# Patient Record
Sex: Male | Born: 1990 | Race: Black or African American | Hispanic: No | Marital: Single | Smoking: Never smoker
Health system: Southern US, Community
[De-identification: ages and names within clinical notes are randomized; demographics above are authoritative.]

## PROBLEM LIST (undated history)

## (undated) DIAGNOSIS — J45909 Unspecified asthma, uncomplicated: Secondary | ICD-10-CM

---
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
## 2013-06-06 ENCOUNTER — Emergency Department (HOSPITAL_COMMUNITY)
Admission: EM | Admit: 2013-06-06 | Discharge: 2013-06-06 | Disposition: A | Discharge status: Home / Self Care | Payer: Self-pay | Attending: Emergency Medicine | Admitting: Emergency Medicine

## 2013-06-06 ENCOUNTER — Encounter (HOSPITAL_COMMUNITY): Payer: Self-pay | Admitting: Emergency Medicine

## 2013-06-06 DIAGNOSIS — G8929 Other chronic pain: Secondary | ICD-10-CM | POA: Insufficient documentation

## 2013-06-06 DIAGNOSIS — J45909 Unspecified asthma, uncomplicated: Secondary | ICD-10-CM | POA: Insufficient documentation

## 2013-06-06 DIAGNOSIS — K089 Disorder of teeth and supporting structures, unspecified: Secondary | ICD-10-CM | POA: Insufficient documentation

## 2013-06-06 DIAGNOSIS — K0889 Other specified disorders of teeth and supporting structures: Secondary | ICD-10-CM

## 2013-06-06 DIAGNOSIS — H9209 Otalgia, unspecified ear: Secondary | ICD-10-CM | POA: Insufficient documentation

## 2013-06-06 HISTORY — DX: Unspecified asthma, uncomplicated: J45.909

## 2013-06-06 MED ORDER — HYDROCODONE-ACETAMINOPHEN 5-325 MG PO TABS
1.0000 | ORAL_TABLET | Freq: Once | ORAL | Status: AC
  Administered 2013-06-06: 1 via ORAL
  Filled 2013-06-06: qty 1

## 2013-06-06 MED ORDER — PENICILLIN V POTASSIUM 500 MG PO TABS: 500.0000 mg | ORAL_TABLET | Freq: Four times a day (QID) | ORAL | Status: AC

## 2013-06-06 MED ORDER — HYDROCODONE-ACETAMINOPHEN 5-325 MG PO TABS: 1.0000 | ORAL_TABLET | ORAL | Status: DC | PRN

## 2013-06-06 NOTE — ED Notes (Signed)
Patient presents with c/o mouth pain that started about 3-4 months ago.  The last couple of weeks he feels like his mouth has been swelling more.  States he thinks it is his wisdom teeth.

## 2013-06-06 NOTE — ED Provider Notes (Signed)
CSN: [id number]     Arrival date & time 06/06/13  1826 History  This chart was scribed for non-physician practitioner Trixie Dredge, PA, working with Ethelda Chick, MD by Ronal Fear, ED scribe. This patient was seen in room TR05C/TR05C and the patient's care was started at 7:31 PM.    Chief Complaint  Patient presents with  . Dental Pain   The history is provided by the patient. No language interpreter was used.  HPI Comments: Daniel Potts is a 22 y.o. male who presents to the Emergency Department complaining of 4 months of sudden onset throbbing HA and facial pain from 10/10 dental pain that suddenly worsened this week.  Pain is worse with eating hot food. He has taken ibuprofen and has tried gargling with no relief.  Pt denies fever, chills, body aches, difficulty breathing or swallowing, and ear pain. Pt does not appear to be in any acute distress with no other complaints.   Past Medical History  Diagnosis Date  . Asthma    History reviewed. No pertinent past surgical history. History reviewed. No pertinent family history. History  Substance Use Topics  . Smoking status: Never Smoker   . Smokeless tobacco: Never Used  . Alcohol Use: No    Review of Systems  Constitutional: Negative for fever and chills.  HENT: Positive for ear pain. Negative for facial swelling, sore throat and trouble swallowing.   Respiratory: Negative for cough and shortness of breath.   Cardiovascular: Negative for chest pain.  Gastrointestinal: Negative for vomiting.    Allergies  Shellfish allergy  Home Medications   Current Outpatient Rx  Name  Route  Sig  Dispense  Refill  . ibuprofen (ADVIL,MOTRIN) 200 MG tablet   Oral   Take 400 mg by mouth every 6 (six) hours as needed for pain (pain).          BP 137/79  Pulse 64  Temp(Src) 97.8 F (36.6 C) (Oral)  Resp 18  Ht 6\' 2"  (1.88 m)  Wt 160 lb 12.8 oz (72.938 kg)  BMI 20.64 kg/m2  SpO2 100% Physical Exam  Nursing note and vitals  reviewed. Constitutional: He appears well-developed and well-nourished. No distress.  HENT:  Head: Normocephalic and atraumatic.  Mouth/Throat: Oropharynx is clear and moist. No oropharyngeal exudate, posterior oropharyngeal edema or posterior oropharyngeal erythema.  Left lower 3rd molar is non tender with signs of decay. Left upper 3rd molar with tenderness posterior to tooth and adjacent to tooth with erythema of gingiva, no discharge. No tenderness to percussion of tooth No facial swelling, left TM normal, no cervical adenopathy. No exudate, no edema. no erythema  Neck: Neck supple.  Pulmonary/Chest: Effort normal.  Neurological: He is alert.  Skin: He is not diaphoretic.    ED Course  Procedures (including critical care time)  DIAGNOSTIC STUDIES: Oxygen Saturation is 100% on RA, normal by my interpretation.    COORDINATION OF CARE:   7:44 PM- Pt advised of plan for treatment including a dental referral and pain medication and pt agrees.   Labs Review Labs Reviewed - No data to display Imaging Review No results found.  EKG Interpretation   None       MDM   1. Pain, dental    Pt with chronic dental pain with sudden worsening this week.  No obvious abscess but will treat as abscess.  There is erythema surrounding the affected tooth.  D/C home with penicillin, vicodin, dental follow up and dental resources.  Discussed  findings, treatment, and follow up  with patient.  Pt given return precautions.  Pt verbalizes understanding and agrees with plan.      I personally performed the services described in this documentation, which was scribed in my presence. The recorded information has been reviewed and is accurate.   I doubt any other EMC precluding discharge at this time including, but not necessarily limited to the following: deep space head or neck infection, ludwig's angina   [place], PA-C 06/07/13 0011

## 2013-06-06 NOTE — ED Notes (Signed)
Patient present with c/i mouth pain.  States he feels like his gums are swelling up in the back.  Pain has been going on for 3-4 months but in the last 2 weeks has gotten worse.  Has been taking Motrin, using Oralgel and brushing his teeth with salt water

## 2013-06-07 NOTE — ED Provider Notes (Signed)
Medical screening examination/treatment/procedure(s) were performed by non-physician practitioner and as supervising physician I was immediately available for consultation/collaboration.  EKG Interpretation   None        Ethelda Chick, MD 06/07/13 [phone]

## 2018-04-06 ENCOUNTER — Encounter (HOSPITAL_COMMUNITY): Payer: Self-pay | Admitting: Emergency Medicine

## 2018-04-06 ENCOUNTER — Emergency Department (HOSPITAL_COMMUNITY): Payer: Self-pay

## 2018-04-06 ENCOUNTER — Emergency Department (HOSPITAL_COMMUNITY)
Admission: EM | Admit: 2018-04-06 | Discharge: 2018-04-06 | Disposition: A | Discharge status: Home / Self Care | Payer: Self-pay | Attending: Emergency Medicine | Admitting: Emergency Medicine

## 2018-04-06 DIAGNOSIS — Y929 Unspecified place or not applicable: Secondary | ICD-10-CM | POA: Insufficient documentation

## 2018-04-06 DIAGNOSIS — S62339A Displaced fracture of neck of unspecified metacarpal bone, initial encounter for closed fracture: Secondary | ICD-10-CM

## 2018-04-06 DIAGNOSIS — J45909 Unspecified asthma, uncomplicated: Secondary | ICD-10-CM | POA: Insufficient documentation

## 2018-04-06 DIAGNOSIS — W228XXA Striking against or struck by other objects, initial encounter: Secondary | ICD-10-CM | POA: Insufficient documentation

## 2018-04-06 DIAGNOSIS — S62326A Displaced fracture of shaft of fifth metacarpal bone, right hand, initial encounter for closed fracture: Secondary | ICD-10-CM | POA: Insufficient documentation

## 2018-04-06 DIAGNOSIS — Y999 Unspecified external cause status: Secondary | ICD-10-CM | POA: Insufficient documentation

## 2018-04-06 DIAGNOSIS — Y9389 Activity, other specified: Secondary | ICD-10-CM | POA: Insufficient documentation

## 2018-04-06 DIAGNOSIS — Z79899 Other long term (current) drug therapy: Secondary | ICD-10-CM | POA: Insufficient documentation

## 2018-04-06 MED ORDER — HYDROCODONE-ACETAMINOPHEN 5-325 MG PO TABS: 1.0000 | ORAL_TABLET | ORAL | 0 refills | Status: DC | PRN

## 2018-04-06 MED ORDER — OXYCODONE-ACETAMINOPHEN 5-325 MG PO TABS
1.0000 | ORAL_TABLET | Freq: Once | ORAL | Status: AC
  Administered 2018-04-06: 1 via ORAL
  Filled 2018-04-06: qty 1

## 2018-04-06 NOTE — ED Triage Notes (Signed)
Pt reports he hit a brick wall w/ his left hand.  Hand is swelling and small abrasion to knuckle.

## 2018-04-06 NOTE — ED Notes (Signed)
Unable to obtain e-sign d/t equipment malfunction.  Pt verbalized understanding of d/c instructions, medications, f/u and return precautions.

## 2018-04-06 NOTE — Discharge Instructions (Signed)
Take the prescribed medication as directed.  Leave splint in place until follow-up. Follow-up with Dr. Amanda Pea-- call for appt. Return to the ED for new or worsening symptoms.

## 2018-04-06 NOTE — ED Provider Notes (Signed)
[facility] EMERGENCY DEPARTMENT Provider Note   CSN: [id number] Arrival date & time: 04/06/18  0315     History   Chief Complaint Chief Complaint  Patient presents with  . Hand Injury    HPI MAKHAI FULCO is a 27 y.o. male.  The history is provided by the patient and medical records.  Hand Injury      27 y.o. M with hx of asthma, presenting to the ED for right hand pain.  States he got upset and punched a brick wall.  States he did not realize the wall was brick at the time.  Has abrasion to right fifth MCP joint and deformity of the right fifth metacarpal.  Denies numbness/weakness.  No intervention tried PTA.  Past Medical History:  Diagnosis Date  . Asthma     There are no active problems to display for this patient.   History reviewed. No pertinent surgical history.      Home Medications    Prior to Admission medications   Medication Sig Start Date End Date Taking? Authorizing Provider  HYDROcodone-acetaminophen (NORCO/VICODIN) 5-325 MG per tablet Take 1 tablet by mouth every 4 (four) hours as needed for pain. 06/06/13   Trixie Dredge, PA-C  ibuprofen (ADVIL,MOTRIN) 200 MG tablet Take 400 mg by mouth every 6 (six) hours as needed for pain (pain).    [provider]    Family History No family history on file.  Social History Social History   Tobacco Use  . Smoking status: Never Smoker  . Smokeless tobacco: Never Used  Substance Use Topics  . Alcohol use: No  . Drug use: No     Allergies   Shellfish allergy   Review of Systems Review of Systems  Musculoskeletal: Positive for arthralgias and joint swelling.  All other systems reviewed and are negative.    Physical Exam Updated Vital Signs BP 123/88   Pulse 60   Temp 98.9 F (37.2 C) (Oral)   Resp 16   SpO2 99%   Physical Exam  Constitutional: He is oriented to person, place, and time. He appears well-developed and well-nourished.  HENT:  Head:  Normocephalic and atraumatic.  Mouth/Throat: Oropharynx is clear and moist.  Eyes: Pupils are equal, round, and reactive to light. Conjunctivae and EOM are normal.  Neck: Normal range of motion.  Cardiovascular: Normal rate, regular rhythm and normal heart sounds.  Pulmonary/Chest: Effort normal and breath sounds normal.  Abdominal: Soft. Bowel sounds are normal.  Musculoskeletal: Normal range of motion.  Abrasion over right 5th MCP joint without open wound/laceration; deformity over distal 5th metacarpal; able to move fingers normally but pain when moving 5th digit; normal distal sensation/perfusion  Neurological: He is alert and oriented to person, place, and time.  Skin: Skin is warm and dry.  Psychiatric: He has a normal mood and affect.  Nursing note and vitals reviewed.    ED Treatments / Results  Labs (all labs ordered are listed, but only abnormal results are displayed) Labs Reviewed - No data to display  EKG None  Radiology Dg Hand Complete Right  Result Date: 04/06/2018 CLINICAL DATA:  Struck a brick wall. EXAM: RIGHT HAND - COMPLETE 3+ VIEW COMPARISON:  None. FINDINGS: Acute comminuted distal fifth metacarpal metadiaphyseal fracture without intra-articular extension. Palmar angulation of the distal bony fragments. No dislocation. No destructive bony lesions. Dorsal hand soft tissue swelling without subcutaneous gas or radiopaque foreign bodies. IMPRESSION: Acute displaced fifth metacarpus fracture. Electronically Signed   By:  Awilda Metro M.D.   On: 04/06/2018 04:20    Procedures Procedures (including critical care time)  Medications Ordered in ED Medications  oxyCODONE-acetaminophen (PERCOCET/ROXICET) 5-325 MG per tablet 1 tablet (has no administration in time range)     Initial Impression / Assessment and Plan / ED Course  I have reviewed the triage vital signs and the nursing notes.  Pertinent labs & imaging results that were available during my care of the  patient were reviewed by me and considered in my medical decision making (see chart for details).  27 year old male here with right hand injury after punching a brick wall.  Arrives with deformity of the right fifth metacarpal.  X-ray confirms boxer's fracture.  Hand is neurovascularly intact.  Ulnar gutter splint was placed and he will be referred to hand surgery for follow-up.  Discussed plan with patient, he acknowledged understanding and agreed with plan of care.  Return precautions given for new or worsening symptoms.  Final Clinical Impressions(s) / ED Diagnoses   Final diagnoses:  Closed boxer's fracture, initial encounter    ED Discharge Orders         Ordered    HYDROcodone-acetaminophen (NORCO/VICODIN) 5-325 MG tablet  Every 4 hours PRN     04/06/18 0532           Garlon Hatchet, PA-C 04/06/18 5374    Geoffery Lyons, MD 04/06/18 [phone]

## 2018-04-06 NOTE — ED Notes (Signed)
Patient transported to X-ray 

## 2018-04-06 NOTE — ED Notes (Signed)
Ortho tech at bedside for short arm splint

## 2018-04-08 ENCOUNTER — Ambulatory Visit (INDEPENDENT_AMBULATORY_CARE_PROVIDER_SITE_OTHER): Payer: Self-pay | Admitting: Family Medicine

## 2018-04-08 ENCOUNTER — Encounter: Payer: Self-pay | Admitting: Family Medicine

## 2018-04-08 VITALS — BP 108/71 | HR 58 | Ht 74.0 in | Wt 178.0 lb

## 2018-04-08 DIAGNOSIS — S62339A Displaced fracture of neck of unspecified metacarpal bone, initial encounter for closed fracture: Secondary | ICD-10-CM

## 2018-04-08 NOTE — Patient Instructions (Signed)
Change the dressing every 2 days at least (more if it seems like it's soaking through though I doubt this will be the case). You have a boxer's fracture of your 5th metacarpal. Tylenol and/or ibuprofen as needed for pain. Follow up with me in 1 week for repeat x-rays, we will discuss transition to a cast. These will take about 6 weeks to heal typically. Elevate above your heart when possible to help with the swelling.

## 2018-04-08 NOTE — Progress Notes (Signed)
PCP: Patient, No Pcp Per  Subjective:   HPI: Patient is a 27 y.o. male here for right hand injury.  Patient reports on 9/1 he punched a wall sustaining injury to ulnar side of right hand. Pain sharp, now down to 5/10 level. Has been wearing ulnar gutter splint regularly. Had a cut between 4th and 5th digits, covered with gauze. No fever. No numbness, other skin changes. No prior injuries. 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  Past Medical History:  Diagnosis Date  . Asthma     Current Outpatient Medications on File Prior to Visit  Medication Sig Dispense Refill  . HYDROcodone-acetaminophen (NORCO/VICODIN) 5-325 MG tablet Take 1 tablet by mouth every 4 (four) hours as needed. 20 tablet 0  . ibuprofen (ADVIL,MOTRIN) 200 MG tablet Take 400 mg by mouth every 6 (six) hours as needed for pain (pain).     No current facility-administered medications on file prior to visit.     History reviewed. No pertinent surgical history.  Allergies  Allergen Reactions  . Shellfish Allergy     SOB, hives, swelling    Social History   Socioeconomic History  . Marital status: Single    Spouse name: Not on file  . Number of children: Not on file  . Years of education: Not on file  . Highest education level: Not on file  Occupational History  . Not on file  Social Needs  . Financial resource strain: Not on file  . Food insecurity:    Worry: Not on file    Inability: Not on file  . Transportation needs:    Medical: Not on file    Non-medical: Not on file  Tobacco Use  . Smoking status: Never Smoker  . Smokeless tobacco: Never Used  Substance and Sexual Activity  . Alcohol use: No  . Drug use: No  . Sexual activity: Yes    Birth control/protection: Condom  Lifestyle  . Physical activity:    Days per week: Not on file    Minutes per session: Not on file  . Stress: Not on file  Relationships  . Social connections:    Talks on phone: Not on file    Gets together: Not on  file    Attends religious service: Not on file    Active member of club or organization: Not on file    Attends meetings of clubs or organizations: Not on file    Relationship status: Not on file  . Intimate partner violence:    Fear of current or ex partner: Not on file    Emotionally abused: Not on file    Physically abused: Not on file    Forced sexual activity: Not on file  Other Topics Concern  . Not on file  Social History Narrative  . Not on file    History reviewed. No pertinent family history.  BP 108/71   Pulse (!) 58   Ht 6\' 2"  (1.88 m)   Wt 178 lb (80.7 kg)   BMI 22.85 kg/m   Review of Systems: See HPI above.     Objective:  Physical Exam:  Gen: NAD, comfortable in exam room  Right hand: Splint removed. Small laceration noted web space between 4th, 5th digits, minimal separation.  No redness, other skin changes. Swelling dorsal distal hand, into 4th and 5th digit. Able to flex and extend at MCP, PIP, DIP joints and wrist. TTP ulnar side of hand, 5th metacarpal. NVI distally.  Assessment & Plan:  1. Right 5th metacarpal boxer's fracture - Independently reviewed radiographs and noted fracture with mild volar angulation.  Dressing change made to small laceration.  Continue with ulnar gutter splint for 1 week.  Tylenol and/or ibuprofen if needed.  Elevation.  F/u in 1 week.

## 2018-04-15 ENCOUNTER — Ambulatory Visit (HOSPITAL_BASED_OUTPATIENT_CLINIC_OR_DEPARTMENT_OTHER)
Admission: RE | Admit: 2018-04-15 | Discharge: 2018-04-15 | Disposition: A | Discharge status: Home / Self Care | Payer: Self-pay | Source: Ambulatory Visit | Attending: Family Medicine | Admitting: Family Medicine

## 2018-04-15 ENCOUNTER — Encounter: Payer: Self-pay | Admitting: Family Medicine

## 2018-04-15 ENCOUNTER — Ambulatory Visit (INDEPENDENT_AMBULATORY_CARE_PROVIDER_SITE_OTHER): Payer: Self-pay | Admitting: Family Medicine

## 2018-04-15 VITALS — BP 109/74 | HR 60 | Ht 74.0 in | Wt 180.0 lb

## 2018-04-15 DIAGNOSIS — S62366D Nondisplaced fracture of neck of fifth metacarpal bone, right hand, subsequent encounter for fracture with routine healing: Secondary | ICD-10-CM | POA: Insufficient documentation

## 2018-04-15 DIAGNOSIS — X58XXXD Exposure to other specified factors, subsequent encounter: Secondary | ICD-10-CM | POA: Insufficient documentation

## 2018-04-15 DIAGNOSIS — S6291XS Unspecified fracture of right wrist and hand, sequela: Secondary | ICD-10-CM

## 2018-04-15 NOTE — Patient Instructions (Signed)
Your x-rays look great. You have a boxer's fracture of your 5th metacarpal. Tylenol and/or ibuprofen as needed for pain. Follow up with me in 2 weeks. If you wear the splint only remove twice a week to wash the area, dry it, then put it back on. These will take about 6 weeks total to heal typically. Elevate above your heart when possible to help with the swelling.

## 2018-04-17 ENCOUNTER — Encounter: Payer: Self-pay | Admitting: Family Medicine

## 2018-04-17 NOTE — Progress Notes (Signed)
PCP: Patient, No Pcp Per  Subjective:   HPI: Patient is a 27 y.o. male here for right hand injury.  9/3: Patient reports on 9/1 he punched a wall sustaining injury to ulnar side of right hand. Pain sharp, now down to 5/10 level. Has been wearing ulnar gutter splint regularly. Had a cut between 4th and 5th digits, covered with gauze. No fever. No numbness, other skin changes. No prior injuries. 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  9/10: Patient returns doing well. Pain level down to 0/10. Has been tolerating splint. No skin changes.  Past Medical History:  Diagnosis Date  . Asthma     Current Outpatient Medications on File Prior to Visit  Medication Sig Dispense Refill  . HYDROcodone-acetaminophen (NORCO/VICODIN) 5-325 MG tablet Take 1 tablet by mouth every 4 (four) hours as needed. 20 tablet 0  . ibuprofen (ADVIL,MOTRIN) 200 MG tablet Take 400 mg by mouth every 6 (six) hours as needed for pain (pain).     No current facility-administered medications on file prior to visit.     History reviewed. No pertinent surgical history.  Allergies  Allergen Reactions  . Shellfish Allergy     SOB, hives, swelling    Social History   Socioeconomic History  . Marital status: Single    Spouse name: Not on file  . Number of children: Not on file  . Years of education: Not on file  . Highest education level: Not on file  Occupational History  . Not on file  Social Needs  . Financial resource strain: Not on file  . Food insecurity:    Worry: Not on file    Inability: Not on file  . Transportation needs:    Medical: Not on file    Non-medical: Not on file  Tobacco Use  . Smoking status: Never Smoker  . Smokeless tobacco: Never Used  Substance and Sexual Activity  . Alcohol use: No  . Drug use: No  . Sexual activity: Yes    Birth control/protection: Condom  Lifestyle  . Physical activity:    Days per week: Not on file    Minutes per session: Not on file  .  Stress: Not on file  Relationships  . Social connections:    Talks on phone: Not on file    Gets together: Not on file    Attends religious service: Not on file    Active member of club or organization: Not on file    Attends meetings of clubs or organizations: Not on file    Relationship status: Not on file  . Intimate partner violence:    Fear of current or ex partner: Not on file    Emotionally abused: Not on file    Physically abused: Not on file    Forced sexual activity: Not on file  Other Topics Concern  . Not on file  Social History Narrative  . Not on file    History reviewed. No pertinent family history.  BP 109/74   Pulse 60   Ht 6\' 2"  (1.88 m)   Wt 180 lb (81.6 kg)   BMI 23.11 kg/m   Review of Systems: See HPI above.     Objective:  Physical Exam:  Gen: NAD, comfortable in exam room  Right hand: Laceration nearly completely healed between 4th, 5th digits.  Loss of knuckle.  No bruising, other deformity.  No malrotation or angulation. Decreased motion of 5th MCP - lacks about 5 degrees extension.  Only  flexes to 40 degrees.  Strength 5/5 with finger extension and flexion at MCP, PIP, DIP joints. Minimal tenderness of 5th metacarpal head. NVI distally.  Assessment & Plan:  1. Right 5th metacarpal boxer's fracture - Independently reviewed repeat radiographs and no additional displacement, should heal well with conservative treatment.  Placed inn ulnar gutter cast today.  Tylenol, ibuprofen if needed.  F/u in 2 weeks to remove, repeat x-rays, and place new splint or cast.

## 2018-04-29 ENCOUNTER — Ambulatory Visit: Payer: Self-pay | Admitting: Family Medicine

## 2018-05-02 ENCOUNTER — Ambulatory Visit (HOSPITAL_BASED_OUTPATIENT_CLINIC_OR_DEPARTMENT_OTHER)
Admission: RE | Admit: 2018-05-02 | Discharge: 2018-05-02 | Disposition: A | Discharge status: Home / Self Care | Payer: Self-pay | Source: Ambulatory Visit | Attending: Family Medicine | Admitting: Family Medicine

## 2018-05-02 ENCOUNTER — Ambulatory Visit (INDEPENDENT_AMBULATORY_CARE_PROVIDER_SITE_OTHER): Payer: Self-pay | Admitting: Family Medicine

## 2018-05-02 ENCOUNTER — Encounter: Payer: Self-pay | Admitting: Family Medicine

## 2018-05-02 VITALS — BP 117/74 | HR 55 | Ht 74.0 in | Wt 180.0 lb

## 2018-05-02 DIAGNOSIS — S62339D Displaced fracture of neck of unspecified metacarpal bone, subsequent encounter for fracture with routine healing: Secondary | ICD-10-CM

## 2018-05-02 DIAGNOSIS — X58XXXD Exposure to other specified factors, subsequent encounter: Secondary | ICD-10-CM | POA: Insufficient documentation

## 2018-05-02 NOTE — Patient Instructions (Signed)
You're doing great. Return to work on Monday. I wouldn't play any sports that would put you at risk of falling or striking your hand for the next 2 weeks. Expect some soreness the next few days. Tylenol, ibuprofen only if needed. Follow up with me as needed.

## 2018-05-04 ENCOUNTER — Encounter: Payer: Self-pay | Admitting: Family Medicine

## 2018-05-04 NOTE — Progress Notes (Signed)
PCP: Patient, No Pcp Per  Subjective:   HPI: Patient is a 27 y.o. male here for right hand injury.  9/3: Patient reports on 9/1 he punched a wall sustaining injury to ulnar side of right hand. Pain sharp, now down to 5/10 level. Has been wearing ulnar gutter splint regularly. Had a cut between 4th and 5th digits, covered with gauze. No fever. No numbness, other skin changes. No prior injuries. 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  9/10: Patient returns doing well. Pain level down to 0/10. Has been tolerating splint. No skin changes.  9/27: Patient reports he's doing well. Denies any pain. Is wearing ulnar gutter splint. No skin changes.  Past Medical History:  Diagnosis Date  . Asthma     Current Outpatient Medications on File Prior to Visit  Medication Sig Dispense Refill  . HYDROcodone-acetaminophen (NORCO/VICODIN) 5-325 MG tablet Take 1 tablet by mouth every 4 (four) hours as needed. 20 tablet 0  . ibuprofen (ADVIL,MOTRIN) 200 MG tablet Take 400 mg by mouth every 6 (six) hours as needed for pain (pain).     No current facility-administered medications on file prior to visit.     History reviewed. No pertinent surgical history.  Allergies  Allergen Reactions  . Shellfish Allergy     SOB, hives, swelling    Social History   Socioeconomic History  . Marital status: Single    Spouse name: Not on file  . Number of children: Not on file  . Years of education: Not on file  . Highest education level: Not on file  Occupational History  . Not on file  Social Needs  . Financial resource strain: Not on file  . Food insecurity:    Worry: Not on file    Inability: Not on file  . Transportation needs:    Medical: Not on file    Non-medical: Not on file  Tobacco Use  . Smoking status: Never Smoker  . Smokeless tobacco: Never Used  Substance and Sexual Activity  . Alcohol use: No  . Drug use: No  . Sexual activity: Yes    Birth control/protection:  Condom  Lifestyle  . Physical activity:    Days per week: Not on file    Minutes per session: Not on file  . Stress: Not on file  Relationships  . Social connections:    Talks on phone: Not on file    Gets together: Not on file    Attends religious service: Not on file    Active member of club or organization: Not on file    Attends meetings of clubs or organizations: Not on file    Relationship status: Not on file  . Intimate partner violence:    Fear of current or ex partner: Not on file    Emotionally abused: Not on file    Physically abused: Not on file    Forced sexual activity: Not on file  Other Topics Concern  . Not on file  Social History Narrative  . Not on file    History reviewed. No pertinent family history.  BP 117/74   Pulse (!) 55   Ht 6\' 2"  (1.88 m)   Wt 180 lb (81.6 kg)   BMI 23.11 kg/m   Review of Systems: See HPI above.     Objective:  Physical Exam:  Gen: NAD, comfortable in exam room  Right hand: Loss of right knuckle on making fist.  No deformity, swelling, bruising. FROM with 5/5 strength  finger abduction, extension, flexion. No tenderness to palpation. NVI distally.  Assessment & Plan:  1. Right 5th metacarpal boxer's fracture - independently reviewed repeat radiographs and interval callus formation noted.  Clinically much improved as well.  Discontinue splinting now 4 weeks out and can return to work Monday.  No sports for next 2 weeks that would put him at risk of falling, striking hand.  Tylenol, ibuprofen if he experiences some soreness.  F/u prn.

## 2018-11-11 ENCOUNTER — Emergency Department (HOSPITAL_COMMUNITY): Payer: Self-pay

## 2018-11-11 ENCOUNTER — Observation Stay (HOSPITAL_COMMUNITY)
Admission: EM | Admit: 2018-11-11 | Discharge: 2018-11-12 | Disposition: A | Discharge status: Home / Self Care | Payer: Self-pay | Attending: General Surgery | Admitting: General Surgery

## 2018-11-11 ENCOUNTER — Other Ambulatory Visit: Payer: Self-pay

## 2018-11-11 ENCOUNTER — Encounter (HOSPITAL_COMMUNITY): Payer: Self-pay | Admitting: Emergency Medicine

## 2018-11-11 DIAGNOSIS — K3580 Unspecified acute appendicitis: Principal | ICD-10-CM | POA: Insufficient documentation

## 2018-11-11 DIAGNOSIS — J45909 Unspecified asthma, uncomplicated: Secondary | ICD-10-CM | POA: Insufficient documentation

## 2018-11-11 LAB — URINALYSIS, ROUTINE W REFLEX MICROSCOPIC
Bacteria, UA: NONE SEEN
Bilirubin Urine: NEGATIVE
Glucose, UA: NEGATIVE mg/dL
Hgb urine dipstick: NEGATIVE
Ketones, ur: 80 mg/dL — AB
Leukocytes,Ua: NEGATIVE
Nitrite: NEGATIVE
Protein, ur: 30 mg/dL — AB
Specific Gravity, Urine: 1.038 — ABNORMAL HIGH (ref 1.005–1.030)
pH: 6 (ref 5.0–8.0)

## 2018-11-11 LAB — COMPREHENSIVE METABOLIC PANEL
ALT: 35 U/L (ref 0–44)
AST: 23 U/L (ref 15–41)
Albumin: 4.4 g/dL (ref 3.5–5.0)
Alkaline Phosphatase: 55 U/L (ref 38–126)
Anion gap: 13 (ref 5–15)
BUN: 18 mg/dL (ref 6–20)
CO2: 24 mmol/L (ref 22–32)
Calcium: 9.4 mg/dL (ref 8.9–10.3)
Chloride: 101 mmol/L (ref 98–111)
Creatinine, Ser: 1.15 mg/dL (ref 0.61–1.24)
GFR calc Af Amer: >60 mL/min (ref >60)
GFR calc non Af Amer: >60 mL/min (ref >60)
Glucose, Bld: 125 mg/dL — ABNORMAL HIGH (ref 70–99)
Potassium: 4.3 mmol/L (ref 3.5–5.1)
Sodium: 138 mmol/L (ref 135–145)
Total Bilirubin: 0.9 mg/dL (ref 0.3–1.2)
Total Protein: 7.5 g/dL (ref 6.5–8.1)

## 2018-11-11 LAB — CBC
HCT: 44.9 % (ref 39.0–52.0)
Hemoglobin: 14.7 g/dL (ref 13.0–17.0)
MCH: 28.8 pg (ref 26.0–34.0)
MCHC: 32.7 g/dL (ref 30.0–36.0)
MCV: 87.9 fL (ref 80.0–100.0)
Platelets: 103 10*3/uL — ABNORMAL LOW (ref 150–400)
RBC: 5.11 MIL/uL (ref 4.22–5.81)
RDW: 12 % (ref 11.5–15.5)
WBC: 5.5 10*3/uL (ref 4.0–10.5)
nRBC: 0 % (ref 0.0–0.2)

## 2018-11-11 LAB — LIPASE, BLOOD: Lipase: 18 U/L (ref 11–51)

## 2018-11-11 MED ORDER — SODIUM CHLORIDE 0.9 % IV BOLUS
1000.0000 mL | Freq: Once | INTRAVENOUS | Status: AC
  Administered 2018-11-11: 21:00:00 1000 mL via INTRAVENOUS

## 2018-11-11 MED ORDER — IOHEXOL 300 MG/ML  SOLN
100.0000 mL | Freq: Once | INTRAMUSCULAR | Status: AC | PRN
  Administered 2018-11-11: 100 mL via INTRAVENOUS

## 2018-11-11 MED ORDER — MORPHINE SULFATE (PF) 4 MG/ML IV SOLN
4.0000 mg | Freq: Once | INTRAVENOUS | Status: AC
  Administered 2018-11-11: 21:00:00 4 mg via INTRAVENOUS
  Filled 2018-11-11: qty 1

## 2018-11-11 MED ORDER — METRONIDAZOLE IN NACL 5-0.79 MG/ML-% IV SOLN
500.0000 mg | Freq: Once | INTRAVENOUS | Status: AC
  Administered 2018-11-12: 500 mg via INTRAVENOUS
  Filled 2018-11-11: qty 100

## 2018-11-11 MED ORDER — SODIUM CHLORIDE 0.9% FLUSH
3.0000 mL | Freq: Once | INTRAVENOUS | Status: AC
  Administered 2018-11-12: 3 mL via INTRAVENOUS

## 2018-11-11 MED ORDER — MORPHINE SULFATE (PF) 4 MG/ML IV SOLN
4.0000 mg | Freq: Once | INTRAVENOUS | Status: AC
  Administered 2018-11-11: 4 mg via INTRAVENOUS
  Filled 2018-11-11: qty 1

## 2018-11-11 MED ORDER — SODIUM CHLORIDE 0.9 % IV SOLN
2.0000 g | Freq: Once | INTRAVENOUS | Status: AC
  Administered 2018-11-12: 2 g via INTRAVENOUS
  Filled 2018-11-11: qty 20

## 2018-11-11 NOTE — ED Notes (Signed)
Daniel Potts (mother) [phone]. Would like an update. Said they can't understand what her son is saying over the phone. He is speaking unclearly due to pain. AH

## 2018-11-11 NOTE — ED Triage Notes (Signed)
Patient reports hypogastric pain onset this afternoon , denies emesis or diarrhea , no fever or chills . No travel history or sick contact .

## 2018-11-11 NOTE — ED Provider Notes (Signed)
[facility]ENC[facility] Provider Note   CSN: [id number] Arrival date & time: 11/11/18  1943    History   Chief Complaint Chief Complaint  Patient presents with  . Abdominal Pain    HPI Daniel RimJoshua L Potts is a 28 y.o. male with past medical history of asthma who presents today for evaluation of abdominal pain.  He reports that it started at about 1 PM today while he was sitting down.  He tells me "it feels like when you get kicked in the balls and that makes your stomach hurt."  When I asked him if his testicles hurt he says no, just that his stomach hurts.  He denies any penile discharge.  He denies any trauma.  He has never had any abdominal surgeries before.  He denies nausea, vomiting, or diarrhea.  No fevers or chills.     HPI  Past Medical History:  Diagnosis Date  . Asthma     There are no active problems to display for this patient.   History reviewed. No pertinent surgical history.      Home Medications    Prior to Admission medications   Medication Sig Start Date End Date Taking? Authorizing Provider  ibuprofen (ADVIL,MOTRIN) 200 MG tablet Take 400 mg by mouth every 6 (six) hours as needed for pain (pain).   Yes [provider]  HYDROcodone-acetaminophen (NORCO/VICODIN) 5-325 MG tablet Take 1 tablet by mouth every 4 (four) hours as needed. Patient not taking: Reported on 11/11/2018 04/06/18   Garlon HatchetSanders, Lisa M, PA-C    Family History No family history on file.  Social History Social History   Tobacco Use  . Smoking status: Never Smoker  . Smokeless tobacco: Never Used  Substance Use Topics  . Alcohol use: No  . Drug use: No     Allergies   Shellfish allergy   Review of Systems Review of Systems  Constitutional: Negative for chills and fever.  HENT: Negative for congestion.   Cardiovascular: Negative for chest pain.  Gastrointestinal: Positive for abdominal pain. Negative for diarrhea, nausea and vomiting.   Genitourinary: Positive for testicular pain. Negative for decreased urine volume, discharge, dysuria, frequency, genital sores, hematuria, penile pain, penile swelling, scrotal swelling and urgency.  All other systems reviewed and are negative.    Physical Exam Updated Vital Signs BP 118/76   Pulse (!) 56   Temp 97.6 F (36.4 C) (Oral)   Resp 16   Ht 6\' 2"  (1.88 m)   Wt 77.1 kg   SpO2 99%   BMI 21.83 kg/m   Physical Exam Vitals signs and nursing note reviewed. Exam conducted with a chaperone present (Male Tech chaperone).  Constitutional:      General: He is not in acute distress.    Appearance: He is well-developed.  HENT:     Head: Normocephalic and atraumatic.  Eyes:     Conjunctiva/sclera: Conjunctivae normal.  Neck:     Musculoskeletal: Neck supple.  Cardiovascular:     Rate and Rhythm: Normal rate and regular rhythm.     Heart sounds: Normal heart sounds. No murmur.  Pulmonary:     Effort: Pulmonary effort is normal. No respiratory distress.     Breath sounds: Normal breath sounds.  Abdominal:     General: Abdomen is flat. There is no distension.     Palpations: Abdomen is soft.     Tenderness: There is abdominal tenderness in the right lower quadrant. There is no right CVA tenderness or  left CVA tenderness.     Hernia: No hernia is present.  Genitourinary:    Penis: Normal.      Scrotum/Testes:        Right: Tenderness present. Mass or swelling not present.        Left: Tenderness present. Mass or swelling not present.     Comments: Palpation of bilateral testes worsens abdominal pain.   Skin:    General: Skin is warm and dry.  Neurological:     General: No focal deficit present.     Mental Status: He is alert.  Psychiatric:        Mood and Affect: Mood normal.        Behavior: Behavior normal.      ED Treatments / Results  Labs (all labs ordered are listed, but only abnormal results are displayed) Labs Reviewed  COMPREHENSIVE METABOLIC PANEL -  Abnormal; Notable for the following components:      Result Value   Glucose, Bld 125 (*)    All other components within normal limits  CBC - Abnormal; Notable for the following components:   Platelets 103 (*)    All other components within normal limits  URINALYSIS, ROUTINE W REFLEX MICROSCOPIC - Abnormal; Notable for the following components:   Specific Gravity, Urine 1.038 (*)    Ketones, ur 80 (*)    Protein, ur 30 (*)    All other components within normal limits  LIPASE, BLOOD  GC/CHLAMYDIA PROBE AMP ([place]) NOT AT [facility]    EKG None  Radiology Ct Abdomen Pelvis W Contrast  Result Date: 11/11/2018 CLINICAL DATA:  Initial evaluation for acute hypogastric abdominal pain, appendicitis suspected. EXAM: CT ABDOMEN AND PELVIS WITH CONTRAST TECHNIQUE: Multidetector CT imaging of the abdomen and pelvis was performed using the standard protocol following bolus administration of intravenous contrast. CONTRAST:  OMNIPAQUE IOHEXOL 300 MG/ML  SOLN COMPARISON:  None. FINDINGS: Lower chest: Visualized lung bases are clear. Hepatobiliary: Liver demonstrates a normal contrast enhanced appearance. Gallbladder within normal limits. No biliary dilatation. Pancreas: Pancreas within normal limits. Spleen: Spleen within normal limits. Adrenals/Urinary Tract: Adrenal glands are normal. Kidneys equal in size with symmetric enhancement. No nephrolithiasis, hydronephrosis, or focal enhancing renal mass. No hydroureter. Partially distended bladder within normal limits. Stomach/Bowel: Stomach within normal limits. No evidence for bowel obstruction. Appendix seen extending inferiorly from the cecum (series 6, image 56). Appendix is dilated up to 11 mm in diameter. Multiple small calcified appendicoliths present within the appendiceal lumen. Minimal mucosal enhancement with question of subtle early periappendiceal fat stranding. Findings could reflect sequelae of mild and/or early appendicitis. No loculated  collections or evidence for perforation. No other acute inflammatory changes seen about the bowels. Vascular/Lymphatic: Normal intravascular enhancement seen throughout the intra-abdominal aorta and its branch vessels. No adenopathy. Reproductive: Prostate normal. Other: No free air or fluid. Musculoskeletal: No acute osseous finding. No discrete lytic or blastic osseous lesions. IMPRESSION: 1. Dilatation of the appendix up to 11 mm in diameter with multiple internal calcified appendicoliths band question of subtle periappendiceal fat stranding. Findings could reflect sequelae for mild and/or early acute appendicitis. No other complicating features identified. 2. No other acute intra-abdominal or pelvic process. Electronically Signed   By: Rise Mu M.D.   On: 11/11/2018 23:21   US Scrotum W/doppler  Result Date: 11/11/2018 CLINICAL DATA:  Initial evaluation for acute testicular pain radiating to abdomen. EXAM: SCROTAL ULTRASOUND DOPPLER ULTRASOUND OF THE TESTICLES TECHNIQUE: Complete ultrasound examination of the testicles, epididymis, and  other scrotal structures was performed. Color and spectral Doppler ultrasound were also utilized to evaluate blood flow to the testicles. COMPARISON:  None. FINDINGS: Right testicle Measurements: 3.9 x 1.6 x 2.6 cm. No mass or microlithiasis visualized. Left testicle Measurements: 3.7 x 1.7 x 2.6 cm. No mass or microlithiasis visualized. Right epididymis:  Normal in size and appearance. Left epididymis:  Normal in size and appearance. Hydrocele:  None visualized. Varicocele:  Small left-sided varicocele. Pulsed Doppler interrogation of both testes demonstrates normal low resistance arterial and venous waveforms bilaterally. IMPRESSION: 1. Small left-sided varicocele. 2. Otherwise unremarkable and normal scrotal ultrasound. No evidence for torsion or other acute finding. Electronically Signed   By: Rise Mu M.D.   On: 11/11/2018 22:41    Procedures  Procedures (including critical care time)  Medications Ordered in ED Medications  sodium chloride flush (NS) 0.9 % injection 3 mL (has no administration in time range)  cefTRIAXone (ROCEPHIN) 2 g in sodium chloride 0.9 % 100 mL IVPB (has no administration in time range)    And  metroNIDAZOLE (FLAGYL) IVPB 500 mg (has no administration in time range)  morphine 4 MG/ML injection 4 mg (4 mg Intravenous Given 11/11/18 2114)  sodium chloride 0.9 % bolus 1,000 mL (1,000 mLs Intravenous Bolus from Bag 11/11/18 2114)  iohexol (OMNIPAQUE) 300 MG/ML solution 100 mL (100 mLs Intravenous Contrast Given 11/11/18 2304)     Initial Impression / Assessment and Plan / ED Course  I have reviewed the triage vital signs and the nursing notes.  Pertinent labs & imaging results that were available during my care of the patient were reviewed by me and considered in my medical decision making (see chart for details).  Clinical Course as of Nov 10 2344  Tue Nov 11, 2018  2337 Spoke with Dr. Doylene Canard from surgery who will come see patient.  Patient was informed of his results.  Antibiotics ordered.   [EH]    Clinical Course User Index [EH] Cristina Gong, PA-C      Daniel Rim presents today for evaluation of lower abdominal/testicular pain.  He had tenderness to palpation of both his testes, and right lower quadrant abdominal pain.  Given tenderness to testes ultrasound was obtained which did not show evidence of torsion or acute abnormality.  CT abdomen was obtained showing concern for appendicitis with multiple appendicoliths and appendix dilated to 11 mm.  Flagyl and Rocephin were ordered.  I spoke with Dr. Doylene Canard from surgery who will come to see the patient.  Does not have a significant leukocytosis, he does not have any significant electrolyte imbalances.  Platelets are slightly low at 103.  Lipase is not elevated.  UA shows 80 ketones, 30 protein with no bacteria seen.  Patient's pain was treated in  the emergency room with morphine.  He was made n.p.o. and given IV fluids and antibiotics.  Patient remained hemodynamically stable while in the ED.    Final Clinical Impressions(s) / ED Diagnoses   Final diagnoses:  Acute appendicitis, unspecified acute appendicitis type    ED Discharge Orders    None       Cristina Gong, Cordelia Poche 11/12/18 1610    Alvira Monday, MD 11/15/18 2103

## 2018-11-11 NOTE — ED Notes (Signed)
Patient transported to US 

## 2018-11-12 ENCOUNTER — Observation Stay (HOSPITAL_COMMUNITY): Payer: Self-pay | Admitting: Certified Registered Nurse Anesthetist

## 2018-11-12 ENCOUNTER — Encounter (HOSPITAL_COMMUNITY)
Admission: EM | Disposition: A | Discharge status: Home / Self Care | Payer: Self-pay | Source: Home / Self Care | Attending: Emergency Medicine

## 2018-11-12 ENCOUNTER — Encounter (HOSPITAL_COMMUNITY): Payer: Self-pay | Admitting: *Deleted

## 2018-11-12 ENCOUNTER — Other Ambulatory Visit: Payer: Self-pay

## 2018-11-12 DIAGNOSIS — K3580 Unspecified acute appendicitis: Secondary | ICD-10-CM | POA: Diagnosis present

## 2018-11-12 HISTORY — PX: LAPAROSCOPIC APPENDECTOMY: SHX408

## 2018-11-12 LAB — BASIC METABOLIC PANEL
Anion gap: 8 (ref 5–15)
BUN: 15 mg/dL (ref 6–20)
CO2: 19 mmol/L — ABNORMAL LOW (ref 22–32)
Calcium: 8.5 mg/dL — ABNORMAL LOW (ref 8.9–10.3)
Chloride: 106 mmol/L (ref 98–111)
Creatinine, Ser: 1.05 mg/dL (ref 0.61–1.24)
GFR calc Af Amer: >60 mL/min (ref >60)
GFR calc non Af Amer: >60 mL/min (ref >60)
Glucose, Bld: 114 mg/dL — ABNORMAL HIGH (ref 70–99)
Potassium: 4 mmol/L (ref 3.5–5.1)
Sodium: 133 mmol/L — ABNORMAL LOW (ref 135–145)

## 2018-11-12 LAB — CBC
HCT: 42.1 % (ref 39.0–52.0)
Hemoglobin: 13.6 g/dL (ref 13.0–17.0)
MCH: 28.1 pg (ref 26.0–34.0)
MCHC: 32.3 g/dL (ref 30.0–36.0)
MCV: 87 fL (ref 80.0–100.0)
Platelets: 102 10*3/uL — ABNORMAL LOW (ref 150–400)
RBC: 4.84 MIL/uL (ref 4.22–5.81)
RDW: 12 % (ref 11.5–15.5)
WBC: 9.2 10*3/uL (ref 4.0–10.5)
nRBC: 0 % (ref 0.0–0.2)

## 2018-11-12 LAB — GC/CHLAMYDIA PROBE AMP (~~LOC~~) NOT AT ARMC
Chlamydia: NEGATIVE
Neisseria Gonorrhea: NEGATIVE

## 2018-11-12 LAB — SURGICAL PCR SCREEN
MRSA, PCR: NEGATIVE
Staphylococcus aureus: NEGATIVE

## 2018-11-12 LAB — HIV ANTIBODY (ROUTINE TESTING W REFLEX): HIV Screen 4th Generation wRfx: NONREACTIVE

## 2018-11-12 SURGERY — APPENDECTOMY, LAPAROSCOPIC
Anesthesia: General | Site: Abdomen

## 2018-11-12 MED ORDER — DIPHENHYDRAMINE HCL 25 MG PO CAPS: 25.0000 mg | ORAL_CAPSULE | Freq: Four times a day (QID) | ORAL | Status: DC | PRN

## 2018-11-12 MED ORDER — HYDRALAZINE HCL 20 MG/ML IJ SOLN: 10.0000 mg | INTRAMUSCULAR | Status: DC | PRN

## 2018-11-12 MED ORDER — BISACODYL 10 MG RE SUPP: 10.0000 mg | Freq: Every day | RECTAL | Status: DC | PRN

## 2018-11-12 MED ORDER — KETOROLAC TROMETHAMINE 15 MG/ML IJ SOLN
30.0000 mg | Freq: Four times a day (QID) | INTRAMUSCULAR | Status: DC
  Administered 2018-11-12: 30 mg via INTRAVENOUS
  Filled 2018-11-12: qty 2

## 2018-11-12 MED ORDER — SUGAMMADEX SODIUM 200 MG/2ML IV SOLN
INTRAVENOUS | Status: DC | PRN
  Administered 2018-11-12: 200 mg via INTRAVENOUS

## 2018-11-12 MED ORDER — SODIUM CHLORIDE 0.9 % IV SOLN
2.0000 g | INTRAVENOUS | Status: DC
  Filled 2018-11-12: qty 20

## 2018-11-12 MED ORDER — BUPIVACAINE-EPINEPHRINE (PF) 0.25% -1:200000 IJ SOLN
INTRAMUSCULAR | Status: AC
  Filled 2018-11-12: qty 30

## 2018-11-12 MED ORDER — PROPOFOL 10 MG/ML IV BOLUS
INTRAVENOUS | Status: DC | PRN
  Administered 2018-11-12: 200 mg via INTRAVENOUS

## 2018-11-12 MED ORDER — FENTANYL CITRATE (PF) 100 MCG/2ML IJ SOLN: 25.0000 ug | INTRAMUSCULAR | Status: DC | PRN

## 2018-11-12 MED ORDER — STERILE WATER FOR IRRIGATION IR SOLN
Status: DC | PRN
  Administered 2018-11-12: 1000 mL

## 2018-11-12 MED ORDER — SODIUM CHLORIDE 0.9 % IV SOLN
INTRAVENOUS | Status: DC
  Administered 2018-11-12 (×2): via INTRAVENOUS

## 2018-11-12 MED ORDER — KETOROLAC TROMETHAMINE 30 MG/ML IJ SOLN
INTRAMUSCULAR | Status: DC | PRN
  Administered 2018-11-12: 30 mg via INTRAVENOUS

## 2018-11-12 MED ORDER — FENTANYL CITRATE (PF) 250 MCG/5ML IJ SOLN
INTRAMUSCULAR | Status: AC
  Filled 2018-11-12: qty 5

## 2018-11-12 MED ORDER — ONDANSETRON 4 MG PO TBDP: 4.0000 mg | ORAL_TABLET | Freq: Four times a day (QID) | ORAL | Status: DC | PRN

## 2018-11-12 MED ORDER — SODIUM CHLORIDE 0.9 % IR SOLN
Status: DC | PRN
  Administered 2018-11-12: 1000 mL

## 2018-11-12 MED ORDER — LIDOCAINE 2% (20 MG/ML) 5 ML SYRINGE
INTRAMUSCULAR | Status: DC | PRN
  Administered 2018-11-12: 60 mg via INTRAVENOUS

## 2018-11-12 MED ORDER — PROPOFOL 10 MG/ML IV BOLUS
INTRAVENOUS | Status: AC
  Filled 2018-11-12: qty 20

## 2018-11-12 MED ORDER — METRONIDAZOLE IN NACL 5-0.79 MG/ML-% IV SOLN
500.0000 mg | Freq: Three times a day (TID) | INTRAVENOUS | Status: DC
  Administered 2018-11-12: 500 mg via INTRAVENOUS
  Filled 2018-11-12: qty 100

## 2018-11-12 MED ORDER — MIDAZOLAM HCL 5 MG/5ML IJ SOLN
INTRAMUSCULAR | Status: DC | PRN
  Administered 2018-11-12: 2 mg via INTRAVENOUS

## 2018-11-12 MED ORDER — ONDANSETRON HCL 4 MG/2ML IJ SOLN
4.0000 mg | Freq: Four times a day (QID) | INTRAMUSCULAR | Status: DC | PRN
  Administered 2018-11-12: 08:00:00 4 mg via INTRAVENOUS
  Filled 2018-11-12: qty 2

## 2018-11-12 MED ORDER — OXYCODONE HCL 5 MG PO TABS: 5.0000 mg | ORAL_TABLET | Freq: Once | ORAL | Status: DC | PRN

## 2018-11-12 MED ORDER — 0.9 % SODIUM CHLORIDE (POUR BTL) OPTIME
TOPICAL | Status: DC | PRN
  Administered 2018-11-12: 1000 mL

## 2018-11-12 MED ORDER — ACETAMINOPHEN 500 MG PO TABS
1000.0000 mg | ORAL_TABLET | Freq: Four times a day (QID) | ORAL | Status: DC
  Administered 2018-11-12 (×4): 1000 mg via ORAL
  Filled 2018-11-12 (×4): qty 2

## 2018-11-12 MED ORDER — ROCURONIUM BROMIDE 10 MG/ML (PF) SYRINGE
PREFILLED_SYRINGE | INTRAVENOUS | Status: DC | PRN
  Administered 2018-11-12: 50 mg via INTRAVENOUS

## 2018-11-12 MED ORDER — DIPHENHYDRAMINE HCL 50 MG/ML IJ SOLN: 25.0000 mg | Freq: Four times a day (QID) | INTRAMUSCULAR | Status: DC | PRN

## 2018-11-12 MED ORDER — MIDAZOLAM HCL 2 MG/2ML IJ SOLN
INTRAMUSCULAR | Status: AC
  Filled 2018-11-12: qty 2

## 2018-11-12 MED ORDER — BUPIVACAINE-EPINEPHRINE 0.25% -1:200000 IJ SOLN
INTRAMUSCULAR | Status: DC | PRN
  Administered 2018-11-12: 30 mL

## 2018-11-12 MED ORDER — HYDROMORPHONE HCL 1 MG/ML IJ SOLN
0.5000 mg | INTRAMUSCULAR | Status: DC | PRN
  Administered 2018-11-12: 01:00:00 1 mg via INTRAVENOUS
  Filled 2018-11-12 (×2): qty 1

## 2018-11-12 MED ORDER — TRAMADOL HCL 50 MG PO TABS: 50.0000 mg | ORAL_TABLET | Freq: Four times a day (QID) | ORAL | 0 refills | Status: DC | PRN

## 2018-11-12 MED ORDER — ONDANSETRON HCL 4 MG/2ML IJ SOLN: 4.0000 mg | Freq: Four times a day (QID) | INTRAMUSCULAR | Status: DC | PRN

## 2018-11-12 MED ORDER — ENOXAPARIN SODIUM 40 MG/0.4ML ~~LOC~~ SOLN: 40.0000 mg | SUBCUTANEOUS | Status: DC

## 2018-11-12 MED ORDER — TRAMADOL HCL 50 MG PO TABS: 50.0000 mg | ORAL_TABLET | Freq: Four times a day (QID) | ORAL | Status: DC | PRN

## 2018-11-12 MED ORDER — OXYCODONE HCL 5 MG PO TABS
5.0000 mg | ORAL_TABLET | ORAL | Status: DC | PRN
  Administered 2018-11-12: 06:00:00 10 mg via ORAL
  Filled 2018-11-12: qty 2

## 2018-11-12 MED ORDER — SODIUM CHLORIDE 0.9 % IV SOLN
INTRAVENOUS | Status: DC | PRN
  Administered 2018-11-12: 09:00:00 40 ug/min via INTRAVENOUS

## 2018-11-12 MED ORDER — FENTANYL CITRATE (PF) 100 MCG/2ML IJ SOLN
INTRAMUSCULAR | Status: DC | PRN
  Administered 2018-11-12: 100 ug via INTRAVENOUS
  Administered 2018-11-12: 25 ug via INTRAVENOUS

## 2018-11-12 MED ORDER — KETOROLAC TROMETHAMINE 15 MG/ML IJ SOLN
15.0000 mg | Freq: Four times a day (QID) | INTRAMUSCULAR | Status: DC | PRN
  Administered 2018-11-12: 15 mg via INTRAVENOUS
  Filled 2018-11-12: qty 1

## 2018-11-12 MED ORDER — DEXAMETHASONE SODIUM PHOSPHATE 10 MG/ML IJ SOLN
INTRAMUSCULAR | Status: DC | PRN
  Administered 2018-11-12: 8 mg via INTRAVENOUS

## 2018-11-12 MED ORDER — DOCUSATE SODIUM 100 MG PO CAPS: 100.0000 mg | ORAL_CAPSULE | Freq: Two times a day (BID) | ORAL | Status: DC

## 2018-11-12 MED ORDER — DEXAMETHASONE SODIUM PHOSPHATE 10 MG/ML IJ SOLN
INTRAMUSCULAR | Status: AC
  Filled 2018-11-12: qty 1

## 2018-11-12 MED ORDER — METHOCARBAMOL 500 MG PO TABS: 500.0000 mg | ORAL_TABLET | Freq: Four times a day (QID) | ORAL | Status: DC | PRN

## 2018-11-12 MED ORDER — LIDOCAINE 2% (20 MG/ML) 5 ML SYRINGE
INTRAMUSCULAR | Status: AC
  Filled 2018-11-12: qty 5

## 2018-11-12 MED ORDER — GABAPENTIN 300 MG PO CAPS: 300.0000 mg | ORAL_CAPSULE | Freq: Two times a day (BID) | ORAL | Status: DC

## 2018-11-12 MED ORDER — METOPROLOL TARTRATE 5 MG/5ML IV SOLN: 5.0000 mg | Freq: Four times a day (QID) | INTRAVENOUS | Status: DC | PRN

## 2018-11-12 MED ORDER — SUCCINYLCHOLINE CHLORIDE 200 MG/10ML IV SOSY
PREFILLED_SYRINGE | INTRAVENOUS | Status: AC
  Filled 2018-11-12: qty 10

## 2018-11-12 MED ORDER — ONDANSETRON HCL 4 MG/2ML IJ SOLN
INTRAMUSCULAR | Status: AC
  Filled 2018-11-12: qty 2

## 2018-11-12 MED ORDER — ACETAMINOPHEN 500 MG PO TABS: 1000.0000 mg | ORAL_TABLET | Freq: Three times a day (TID) | ORAL | Status: AC | PRN

## 2018-11-12 MED ORDER — LACTATED RINGERS IV SOLN
INTRAVENOUS | Status: DC | PRN
  Administered 2018-11-12: 09:00:00 via INTRAVENOUS

## 2018-11-12 MED ORDER — OXYCODONE HCL 5 MG/5ML PO SOLN: 5.0000 mg | Freq: Once | ORAL | Status: DC | PRN

## 2018-11-12 MED ORDER — GABAPENTIN 300 MG PO CAPS: 300.0000 mg | ORAL_CAPSULE | Freq: Three times a day (TID) | ORAL | Status: DC

## 2018-11-12 MED ORDER — ROCURONIUM BROMIDE 50 MG/5ML IV SOSY
PREFILLED_SYRINGE | INTRAVENOUS | Status: AC
  Filled 2018-11-12: qty 5

## 2018-11-12 MED ORDER — ESMOLOL HCL 100 MG/10ML IV SOLN
INTRAVENOUS | Status: AC
  Filled 2018-11-12: qty 10

## 2018-11-12 SURGICAL SUPPLY — 44 items
APL PRP STRL LF DISP 70% ISPRP (MISCELLANEOUS) ×1
APL SKNCLS STERI-STRIP NONHPOA (GAUZE/BANDAGES/DRESSINGS) ×1
BAG SPEC RTRVL 10 TROC 200 (ENDOMECHANICALS) ×1
BANDAGE ADH SHEER 1  50/CT (GAUZE/BANDAGES/DRESSINGS) ×2 IMPLANT
BENZOIN TINCTURE PRP APPL 2/3 (GAUZE/BANDAGES/DRESSINGS) ×2 IMPLANT
BLADE CLIPPER SURG (BLADE) IMPLANT
CANISTER SUCT 3000ML PPV (MISCELLANEOUS) ×3 IMPLANT
CHLORAPREP W/TINT 26 (MISCELLANEOUS) ×2 IMPLANT
CLOSURE WOUND 1/2 X4 (GAUZE/BANDAGES/DRESSINGS) ×1
COVER SURGICAL LIGHT HANDLE (MISCELLANEOUS) ×3 IMPLANT
CUTTER FLEX LINEAR 45M (STAPLE) ×3 IMPLANT
DRSG TEGADERM 4X4.75 (GAUZE/BANDAGES/DRESSINGS) ×2 IMPLANT
ELECT REM PT RETURN 9FT ADLT (ELECTROSURGICAL) ×3
ELECTRODE REM PT RTRN 9FT ADLT (ELECTROSURGICAL) ×1 IMPLANT
GAUZE SPONGE 2X2 8PLY STRL LF (GAUZE/BANDAGES/DRESSINGS) IMPLANT
GLOVE BIOGEL M STRL SZ7.5 (GLOVE) ×3 IMPLANT
GLOVE INDICATOR 8.0 STRL GRN (GLOVE) ×4 IMPLANT
GOWN STRL REUS W/ TWL LRG LVL3 (GOWN DISPOSABLE) ×2 IMPLANT
GOWN STRL REUS W/TWL 2XL LVL3 (GOWN DISPOSABLE) ×3 IMPLANT
GOWN STRL REUS W/TWL LRG LVL3 (GOWN DISPOSABLE) ×9
GRASPER SUT TROCAR 14GX15 (MISCELLANEOUS) ×2 IMPLANT
KIT BASIN OR (CUSTOM PROCEDURE TRAY) ×3 IMPLANT
KIT TURNOVER KIT B (KITS) ×3 IMPLANT
NS IRRIG 1000ML POUR BTL (IV SOLUTION) ×3 IMPLANT
PAD ARMBOARD 7.5X6 YLW CONV (MISCELLANEOUS) ×6 IMPLANT
POUCH RETRIEVAL ECOSAC 10 (ENDOMECHANICALS) IMPLANT
POUCH RETRIEVAL ECOSAC 10MM (ENDOMECHANICALS) ×2
RELOAD 45 VASCULAR/THIN (ENDOMECHANICALS) ×3 IMPLANT
RELOAD STAPLE 45 2.5 WHT GRN (ENDOMECHANICALS) IMPLANT
RELOAD STAPLE 45 3.5 BLU ETS (ENDOMECHANICALS) IMPLANT
RELOAD STAPLE TA45 3.5 REG BLU (ENDOMECHANICALS) IMPLANT
SET IRRIG TUBING LAPAROSCOPIC (IRRIGATION / IRRIGATOR) ×3 IMPLANT
SET TUBE SMOKE EVAC HIGH FLOW (TUBING) ×3 IMPLANT
SHEARS HARMONIC ACE PLUS 36CM (ENDOMECHANICALS) ×3 IMPLANT
SLEEVE ENDOPATH XCEL 5M (ENDOMECHANICALS) ×3 IMPLANT
SPECIMEN JAR SMALL (MISCELLANEOUS) ×3 IMPLANT
SPONGE GAUZE 2X2 STER 10/PKG (GAUZE/BANDAGES/DRESSINGS) ×2
STRIP CLOSURE SKIN 1/2X4 (GAUZE/BANDAGES/DRESSINGS) ×1 IMPLANT
SUT MNCRL AB 4-0 PS2 18 (SUTURE) ×3 IMPLANT
SUT VICRYL 0 UR6 27IN ABS (SUTURE) ×2 IMPLANT
TRAY LAPAROSCOPIC MC (CUSTOM PROCEDURE TRAY) ×3 IMPLANT
TROCAR XCEL BLUNT TIP 100MML (ENDOMECHANICALS) ×3 IMPLANT
TROCAR XCEL NON-BLD 5MMX100MML (ENDOMECHANICALS) ×3 IMPLANT
WATER STERILE IRR 1000ML POUR (IV SOLUTION) ×3 IMPLANT

## 2018-11-12 NOTE — Anesthesia Preprocedure Evaluation (Signed)
Anesthesia Evaluation  Patient identified by MRN, date of birth, ID band Patient awake    Reviewed: Allergy & Precautions, H&P , NPO status , Patient's Chart, lab work & pertinent test results  Airway Mallampati: II   Neck ROM: full    Dental   Pulmonary asthma ,    breath sounds clear to auscultation       Cardiovascular negative cardio ROS   Rhythm:regular Rate:Normal     Neuro/Psych    GI/Hepatic Acute appendicitis   Endo/Other    Renal/GU      Musculoskeletal   Abdominal   Peds  Hematology   Anesthesia Other Findings   Reproductive/Obstetrics                             Anesthesia Physical Anesthesia Plan  ASA: II  Anesthesia Plan: General   Post-op Pain Management:    Induction: Intravenous and Rapid sequence  PONV Risk Score and Plan: 2 and Ondansetron, Dexamethasone, Treatment may vary due to age or medical condition and Midazolam  Airway Management Planned: Oral ETT  Additional Equipment:   Intra-op Plan:   Post-operative Plan: Extubation in OR  Informed Consent: I have reviewed the patients History and Physical, chart, labs and discussed the procedure including the risks, benefits and alternatives for the proposed anesthesia with the patient or authorized representative who has indicated his/her understanding and acceptance.       Plan Discussed with: CRNA, Anesthesiologist and Surgeon  Anesthesia Plan Comments:         Anesthesia Quick Evaluation

## 2018-11-12 NOTE — Interval H&P Note (Signed)
History and Physical Interval Note:  11/12/2018 9:05 AM  Daniel Potts  has presented today for surgery, with the diagnosis of acute appendicitis.  The various methods of treatment have been discussed with the patient and family. After consideration of risks, benefits and other options for treatment, the patient has consented to  Procedure(s): APPENDECTOMY LAPAROSCOPIC (N/A) as a surgical intervention.  The patient's history has been reviewed, patient examined, no change in status, stable for surgery.  I have reviewed the patient's chart and labs.  Questions were answered to the patient's satisfaction.    We discussed the etiology and management of acute appendicitis. We discussed operative and nonoperative management.  I recommended operative management along with IV antibiotics.  We discussed laparoscopic appendectomy. We discussed the risk and benefits of surgery including but not limited to bleeding, infection, injury to surrounding structures, need to convert to an open procedure, blood clot formation, post operative abscess or wound infection, staple line complications such as leak or bleeding, hernia formation, post operative ileus, need for additional procedures, anesthesia complications, and the typical postoperative course. I explained that the patient should expect a good improvement in their symptoms.  Gaynelle Adu

## 2018-11-12 NOTE — Op Note (Signed)
Daniel Potts [id number] August 20, 1990 11/12/2018  Appendectomy, Lap, Procedure Note  Indications: The patient presented with a history of right-sided abdominal pain. A CT revealed findings consistent with acute appendicitis.  Pre-operative Diagnosis: Acute appendicitis without mention of peritonitis  Post-operative Diagnosis: Same  Surgeon: Gaynelle Adu MD FACS  Assistants: none  Anesthesia: General endotracheal anesthesia  Procedure Details  The patient was seen again in the Holding Room. The risks, benefits, complications, treatment options, and expected outcomes were discussed with the patient and/or family. The possibilities of perforation of viscus, bleeding, recurrent infection, the need for additional procedures, failure to diagnose a condition, and creating a complication requiring transfusion or operation were discussed. There was concurrence with the proposed plan and informed consent was obtained. The site of surgery was properly noted. The patient was taken to Operating Room, identified as Daniel Potts and the procedure verified as Appendectomy. A Time Out was held and the above information confirmed.  The patient was placed in the supine position and general anesthesia was induced, along with placement of orogastric tube, SCDs, and a Foley catheter. The abdomen was prepped and draped in a sterile fashion. A 1.5 centimeter infraumbilical incision was made.  The umbilical stalk was elevated, and the midline fascia was incised with a #11 blade.  A Kelly clamp was used to confirm entrance into the peritoneal cavity.  A pursestring suture was passed around the incision with a 0 Vicryl.  A 74mm Hasson was introduced into the abdomen and the tails of the suture were used to hold the Hasson in place.   The pneumoperitoneum was then established to steady pressure of 15 mmHg.  Additional 5 mm cannulas then placed in the left lower quadrant of the abdomen and the suprapubic region under  direct visualization. A careful evaluation of the entire abdomen was carried out. The patient was placed in Trendelenburg and left lateral decubitus position. The small intestines were retracted in the cephalad and left lateral direction away from the pelvis and right lower quadrant. The patient was found to have an inflammed appendix that was extending into the pelvis. There was no evidence of perforation.  The appendix was carefully dissected. The appendix was was skeletonized with the harmonic scalpel.   The appendix was divided at its base using an endo-GIA stapler with a white load. No appendiceal stump was left in place. There was no evidence of bleeding, leakage, or complication after division of the appendix. Irrigation was also performed and irrigate suctioned from the abdomen as well. The appendix was removed from the abdomen with an Ecco bag through the umbilical port after performing the appropriate desufflation technique.    The umbilical port site was closed with the purse string suture.  I reestablished pneumoperitoneum.  I placed an additional 0 Vicryl at the umbilical fascia using the PMI suture passer.  The closure was viewed laparoscopically. There was no residual palpable fascial defect.  Additional local was infiltrated in a regional fashion using laparoscopic guidance.  The abdomen was desufflated using the proper desufflation technique.  the trocar site skin wounds were closed with 4-0 Monocryl. Benzoin, steri-strips and bandages were applied to the skin incisions.  Instrument, sponge, and needle counts were correct at the conclusion of the case.   Findings: The appendix was found to be inflamed. There were not signs of necrosis.  There was not perforation. There was not abscess formation.  Estimated Blood Loss:  Minimal         Drains: none  Specimens: appendix         Complications:  None; patient tolerated the procedure well.         Disposition: PACU -  hemodynamically stable.         Condition: stable  Mary SellaEric M. Andrey CampanileWilson, MD, FACS General, Bariatric, & Minimally Invasive Surgery [facility] [place]

## 2018-11-12 NOTE — Discharge Summary (Signed)
[facility] Discharge Summary   Patient ID: Daniel Potts Cancel MRN: [id number] DOB/AGE: [birth year] 28 y.o.  Admit date: 11/11/2018 Discharge date: 11/12/2018  Admitting Diagnosis: Acute appendicitis  Discharge Diagnosis Patient Active Problem List   Diagnosis Date Noted  . Acute appendicitis 11/12/2018    Consultants None  Imaging: Ct Abdomen Pelvis W Contrast  Result Date: 11/11/2018 CLINICAL DATA:  Initial evaluation for acute hypogastric abdominal pain, appendicitis suspected. EXAM: CT ABDOMEN AND PELVIS WITH CONTRAST TECHNIQUE: Multidetector CT imaging of the abdomen and pelvis was performed using the standard protocol following bolus administration of intravenous contrast. CONTRAST:  100mL OMNIPAQUE IOHEXOL 300 MG/ML  SOLN COMPARISON:  None. FINDINGS: Lower chest: Visualized lung bases are clear. Hepatobiliary: Liver demonstrates a normal contrast enhanced appearance. Gallbladder within normal limits. No biliary dilatation. Pancreas: Pancreas within normal limits. Spleen: Spleen within normal limits. Adrenals/Urinary Tract: Adrenal glands are normal. Kidneys equal in size with symmetric enhancement. No nephrolithiasis, hydronephrosis, or focal enhancing renal mass. No hydroureter. Partially distended bladder within normal limits. Stomach/Bowel: Stomach within normal limits. No evidence for bowel obstruction. Appendix seen extending inferiorly from the cecum (series 6, image 56). Appendix is dilated up to 11 mm in diameter. Multiple small calcified appendicoliths present within the appendiceal lumen. Minimal mucosal enhancement with question of subtle early periappendiceal fat stranding. Findings could reflect sequelae of mild and/or early appendicitis. No loculated collections or evidence for perforation. No other acute inflammatory changes seen about the bowels. Vascular/Lymphatic: Normal intravascular enhancement seen throughout the intra-abdominal aorta and its branch vessels.  No adenopathy. Reproductive: Prostate normal. Other: No free air or fluid. Musculoskeletal: No acute osseous finding. No discrete lytic or blastic osseous lesions. IMPRESSION: 1. Dilatation of the appendix up to 11 mm in diameter with multiple internal calcified appendicoliths band question of subtle periappendiceal fat stranding. Findings could reflect sequelae for mild and/or early acute appendicitis. No other complicating features identified. 2. No other acute intra-abdominal or pelvic process. Electronically Signed   By: Rise MuBenjamin  McClintock M.D.   On: 11/11/2018 23:21   [place] Scrotum W/doppler  Result Date: 11/11/2018 CLINICAL DATA:  Initial evaluation for acute testicular pain radiating to abdomen. EXAM: SCROTAL ULTRASOUND DOPPLER ULTRASOUND OF THE TESTICLES TECHNIQUE: Complete ultrasound examination of the testicles, epididymis, and other scrotal structures was performed. Color and spectral Doppler ultrasound were also utilized to evaluate blood flow to the testicles. COMPARISON:  None. FINDINGS: Right testicle Measurements: 3.9 x 1.6 x 2.6 cm. No mass or microlithiasis visualized. Left testicle Measurements: 3.7 x 1.7 x 2.6 cm. No mass or microlithiasis visualized. Right epididymis:  Normal in size and appearance. Left epididymis:  Normal in size and appearance. Hydrocele:  None visualized. Varicocele:  Small left-sided varicocele. Pulsed Doppler interrogation of both testes demonstrates normal low resistance arterial and venous waveforms bilaterally. IMPRESSION: 1. Small left-sided varicocele. 2. Otherwise unremarkable and normal scrotal ultrasound. No evidence for torsion or other acute finding. Electronically Signed   By: Rise MuBenjamin  McClintock M.D.   On: 11/11/2018 22:41    Procedures Dr. Andrey CampanileWilson (11/12/18) - Laparoscopic Appendectomy  Hospital Course:  Daniel Potts Ghosh is a 28yo male [facility] acute onset right lower quadrant abdominal pain.  Workup showed acute appendicitis.   Patient was admitted and underwent procedure listed above.  Tolerated procedure well and was transferred to the floor.  Diet was advanced as tolerated.  On POD0 the patient was voiding well, tolerating diet, ambulating well, pain well controlled, vital signs stable, incisions c/d/i and  felt stable for discharge home.  Patient will follow up as below and knows to call with questions or concerns.     Physical Exam: General:  Alert, NAD, pleasant, comfortable Pulm: effort normal Abd:  Soft, ND, appropriately tender, multiple lap incisions with C/D/I dressings, +BS  Allergies as of 11/12/2018      Reactions   Shellfish Allergy Other (See Comments)   SOB, hives, swelling      Medication List    STOP taking these medications   HYDROcodone-acetaminophen 5-325 MG tablet Commonly known as:  NORCO/VICODIN     TAKE these medications   acetaminophen 500 MG tablet Commonly known as:  TYLENOL Take 2 tablets (1,000 mg total) by mouth every 8 (eight) hours as needed for mild pain.   ibuprofen 200 MG tablet Commonly known as:  ADVIL,MOTRIN Take 400 mg by mouth every 6 (six) hours as needed for pain (pain).   traMADol 50 MG tablet Commonly known as:  ULTRAM Take 1-2 tablets (50-100 mg total) by mouth every 6 (six) hours as needed for severe pain.        Follow-up Information    [facility] Follow up on 11/27/2018.   Specialty:  General Surgery Why:  Appointment scheduled for 2:30pm. A provider will call you during scheduled appointment time. Please send a photo of your incisions with your name and DOB to [email or web address] the day prior to appointment.  Contact information: [address] [place] [address] [phone]          Signed: Franne Forts, [facility] 11/12/2018, 2:15 PM Pager: [phone] Mon-Thurs 7:00 am-4:30 pm Fri 7:00 am -11:30 AM Sat-Sun 7:00 am-11:30 am

## 2018-11-12 NOTE — Discharge Instructions (Signed)

## 2018-11-12 NOTE — Transfer of Care (Signed)
Immediate Anesthesia Transfer of Care Note  Patient: Daniel Potts  Procedure(s) Performed: APPENDECTOMY LAPAROSCOPIC (N/A Abdomen)  Patient Location: PACU  Anesthesia Type:General  Level of Consciousness: awake and alert   Airway & Oxygen Therapy: Patient Spontanous Breathing and Patient connected to face mask oxygen  Post-op Assessment: Report given to RN and Post -op Vital signs reviewed and stable  Post vital signs: Reviewed  Last Vitals:  Vitals Value Taken Time  BP 129/71 11/12/2018 10:23 AM  Temp    Pulse 87 11/12/2018 10:24 AM  Resp 15 11/12/2018 10:24 AM  SpO2 100 % 11/12/2018 10:24 AM  Vitals shown include unvalidated device data.  Last Pain:  Vitals:   11/12/18 0345  TempSrc: Oral  PainSc:          Complications: No apparent anesthesia complications

## 2018-11-12 NOTE — Anesthesia Procedure Notes (Signed)
Procedure Name: Intubation Performed by: Milford Cage, CRNA Pre-anesthesia Checklist: Patient identified, Emergency Drugs available, Suction available and Patient being monitored Patient Re-evaluated:Patient Re-evaluated prior to induction Oxygen Delivery Method: Circle System Utilized Preoxygenation: Pre-oxygenation with 100% oxygen Induction Type: IV induction and Rapid sequence Laryngoscope Size: Mac and 4 Grade View: Grade I Tube type: Oral Tube size: 7.5 mm Number of attempts: 1 Airway Equipment and Method: Stylet and Oral airway Placement Confirmation: ETT inserted through vocal cords under direct vision,  positive ETCO2 and breath sounds checked- equal and bilateral Secured at: 23 cm Tube secured with: Tape Dental Injury: Teeth and Oropharynx as per pre-operative assessment

## 2018-11-12 NOTE — Plan of Care (Signed)

## 2018-11-12 NOTE — ED Notes (Signed)
ED TO INPATIENT HANDOFF REPORT  ED Nurse Name and Phone #: Tray [place], [phone]  S Name/Age/Gender Daniel Potts 28 y.o. male Room/Bed: 028C/028C  Code Status   Code Status: Full Code  Home/SNF/Other Home Patient oriented to: self, place, time and situation Is this baseline? Yes   Triage Complete: Triage complete  Chief Complaint stomach pain  Triage Note Patient reports hypogastric pain onset this afternoon , denies emesis or diarrhea , no fever or chills . No travel history or sick contact .    Allergies Allergies  Allergen Reactions  . Shellfish Allergy Other (See Comments)    SOB, hives, swelling    Level of Care/Admitting Diagnosis ED Disposition    ED Disposition Condition Comment   Admit  Hospital Area: [facility] [[id number]]  Level of Care: Med-Surg [16]  Diagnosis: Acute appendicitis [454098]  Admitting Physician: CCS, MD [[id number]]  Attending Physician: CCS, MD [[id number]]  Bed request comments: 6N  PT Class (Do Not Modify): Observation [104]  PT Acc Code (Do Not Modify): Observation [10022]       B Medical/Surgery History Past Medical History:  Diagnosis Date  . Asthma    History reviewed. No pertinent surgical history.   A IV Location/Drains/Wounds Patient Lines/Drains/Airways Status   Active Line/Drains/Airways    Name:   Placement date:   Placement time:   Site:   Days:   Peripheral IV 11/11/18 Right Antecubital   11/11/18    2042    Antecubital   1   Peripheral IV 11/12/18 Right Hand   11/12/18    0015    Hand   less than 1          Intake/Output Last 24 hours No intake or output data in the 24 hours ending 11/12/18 0025  Labs/Imaging Results for orders placed or performed during the hospital encounter of 11/11/18 (from the past 48 hour(s))  Urinalysis, Routine w reflex microscopic     Status: Abnormal   Collection Time: 11/11/18  8:14 PM  Result Value Ref Range   Color, Urine YELLOW YELLOW   APPearance CLEAR CLEAR    Specific Gravity, Urine 1.038 (H) 1.005 - 1.030   pH 6.0 5.0 - 8.0   Glucose, UA NEGATIVE NEGATIVE mg/dL   Hgb urine dipstick NEGATIVE NEGATIVE   Bilirubin Urine NEGATIVE NEGATIVE   Ketones, ur 80 (A) NEGATIVE mg/dL   Protein, ur 30 (A) NEGATIVE mg/dL   Nitrite NEGATIVE NEGATIVE   Leukocytes,Ua NEGATIVE NEGATIVE   RBC / HPF 0-5 0 - 5 RBC/hpf   WBC, UA 0-5 0 - 5 WBC/hpf   Bacteria, UA NONE SEEN NONE SEEN   Mucus PRESENT    Hyaline Casts, UA PRESENT     Comment: Performed at [facility], [address], [place], [place] [address]  Lipase, blood     Status: None   Collection Time: 11/11/18  8:16 PM  Result Value Ref Range   Lipase 18 11 - 51 U/L    Comment: Performed at [facility], [address], [place], [place] [address]  Comprehensive metabolic panel     Status: Abnormal   Collection Time: 11/11/18  8:16 PM  Result Value Ref Range   Sodium 138 135 - 145 mmol/L   Potassium 4.3 3.5 - 5.1 mmol/L   Chloride 101 98 - 111 mmol/L   CO2 24 22 - 32 mmol/L   Glucose, Bld 125 (H) 70 - 99 mg/dL   BUN 18 6 -  20 mg/dL   Creatinine, Ser 1.63 0.61 - 1.24 mg/dL   Calcium 9.4 8.9 - 84.5 mg/dL   Total Protein 7.5 6.5 - 8.1 g/dL   Albumin 4.4 3.5 - 5.0 g/dL   AST 23 15 - 41 U/L   ALT 35 0 - 44 U/L   Alkaline Phosphatase 55 38 - 126 U/L   Total Bilirubin 0.9 0.3 - 1.2 mg/dL   GFR calc non Af Amer >60 >60 mL/min   GFR calc Af Amer >60 >60 mL/min   Anion gap 13 5 - 15    Comment: Performed at [facility], [address] [place], [place] [address]  CBC     Status: Abnormal   Collection Time: 11/11/18  8:16 PM  Result Value Ref Range   WBC 5.5 4.0 - 10.5 K/uL   RBC 5.11 4.22 - 5.81 MIL/uL   Hemoglobin 14.7 13.0 - 17.0 g/dL   HCT 03.2 12.2 - 48.2 %   MCV 87.9 80.0 - 100.0 fL   MCH 28.8 26.0 - 34.0 pg   MCHC 32.7 30.0 - 36.0 g/dL   RDW 50.0 37.0 - 48.8 %   Platelets 103 (L) 150 - 400 K/uL    Comment: REPEATED TO VERIFY PLATELET COUNT CONFIRMED BY SMEAR SPECIMEN  CHECKED FOR CLOTS Immature Platelet Fraction may be clinically indicated, consider ordering this additional test [id number]    nRBC 0.0 0.0 - 0.2 %    Comment: Performed at [facility], [address] [place], [place] [address]   Ct Abdomen Pelvis W Contrast  Result Date: 11/11/2018 CLINICAL DATA:  Initial evaluation for acute hypogastric abdominal pain, appendicitis suspected. EXAM: CT ABDOMEN AND PELVIS WITH CONTRAST TECHNIQUE: Multidetector CT imaging of the abdomen and pelvis was performed using the standard protocol following bolus administration of intravenous contrast. CONTRAST:  OMNIPAQUE IOHEXOL 300 MG/ML  SOLN COMPARISON:  None. FINDINGS: Lower chest: Visualized lung bases are clear. Hepatobiliary: Liver demonstrates a normal contrast enhanced appearance. Gallbladder within normal limits. No biliary dilatation. Pancreas: Pancreas within normal limits. Spleen: Spleen within normal limits. Adrenals/Urinary Tract: Adrenal glands are normal. Kidneys equal in size with symmetric enhancement. No nephrolithiasis, hydronephrosis, or focal enhancing renal mass. No hydroureter. Partially distended bladder within normal limits. Stomach/Bowel: Stomach within normal limits. No evidence for bowel obstruction. Appendix seen extending inferiorly from the cecum (series 6, image 56). Appendix is dilated up to 11 mm in diameter. Multiple small calcified appendicoliths present within the appendiceal lumen. Minimal mucosal enhancement with question of subtle early periappendiceal fat stranding. Findings could reflect sequelae of mild and/or early appendicitis. No loculated collections or evidence for perforation. No other acute inflammatory changes seen about the bowels. Vascular/Lymphatic: Normal intravascular enhancement seen throughout the intra-abdominal aorta and its branch vessels. No adenopathy. Reproductive: Prostate normal. Other: No free air or fluid. Musculoskeletal: No acute osseous finding.  No discrete lytic or blastic osseous lesions. IMPRESSION: 1. Dilatation of the appendix up to 11 mm in diameter with multiple internal calcified appendicoliths band question of subtle periappendiceal fat stranding. Findings could reflect sequelae for mild and/or early acute appendicitis. No other complicating features identified. 2. No other acute intra-abdominal or pelvic process. Electronically Signed   By: Rise Mu M.D.   On: 11/11/2018 23:21   US Scrotum W/doppler  Result Date: 11/11/2018 CLINICAL DATA:  Initial evaluation for acute testicular pain radiating to abdomen. EXAM: SCROTAL ULTRASOUND DOPPLER ULTRASOUND OF THE TESTICLES TECHNIQUE: Complete ultrasound examination of the testicles, epididymis, and  other scrotal structures was performed. Color and spectral Doppler ultrasound were also utilized to evaluate blood flow to the testicles. COMPARISON:  None. FINDINGS: Right testicle Measurements: 3.9 x 1.6 x 2.6 cm. No mass or microlithiasis visualized. Left testicle Measurements: 3.7 x 1.7 x 2.6 cm. No mass or microlithiasis visualized. Right epididymis:  Normal in size and appearance. Left epididymis:  Normal in size and appearance. Hydrocele:  None visualized. Varicocele:  Small left-sided varicocele. Pulsed Doppler interrogation of both testes demonstrates normal low resistance arterial and venous waveforms bilaterally. IMPRESSION: 1. Small left-sided varicocele. 2. Otherwise unremarkable and normal scrotal ultrasound. No evidence for torsion or other acute finding. Electronically Signed   By: Rise Mu M.D.   On: 11/11/2018 22:41    Pending Labs Unresulted Labs (From admission, onward)    Start     Ordered   11/12/18 0500  Basic metabolic panel  Tomorrow morning,   R     11/12/18 0007   11/12/18 0500  CBC  Tomorrow morning,   R     11/12/18 0007   11/12/18 0003  HIV antibody (Routine Testing)  Once,   R     11/12/18 0007          Vitals/Pain Today's Vitals    11/11/18 2100 11/11/18 2130 11/11/18 2230 11/11/18 2233  BP: 115/67 116/65 118/76   Pulse: (!) 44 (!) 49 (!) 56   Resp:      Temp:      TempSrc:      SpO2: 100% 98% 99%   Weight:      Height:      PainSc:    10-Worst pain ever    Isolation Precautions No active isolations  Medications Medications  sodium chloride flush (NS) 0.9 % injection 3 mL (has no administration in time range)  cefTRIAXone (ROCEPHIN) 2 g in sodium chloride 0.9 % 100 mL IVPB (2 g Intravenous New Bag/Given 11/12/18 0019)    And  metroNIDAZOLE (FLAGYL) IVPB 500 mg (500 mg Intravenous New Bag/Given 11/12/18 0020)  enoxaparin (LOVENOX) injection 40 mg (has no administration in time range)  0.9 %  sodium chloride infusion (has no administration in time range)  cefTRIAXone (ROCEPHIN) 2 g in sodium chloride 0.9 % 100 mL IVPB (has no administration in time range)    And  metroNIDAZOLE (FLAGYL) IVPB 500 mg (has no administration in time range)  acetaminophen (TYLENOL) tablet 1,000 mg (has no administration in time range)  ketorolac (TORADOL) 15 MG/ML injection 15 mg (has no administration in time range)  traMADol (ULTRAM) tablet 50 mg (has no administration in time range)  oxyCODONE (Oxy IR/ROXICODONE) immediate release tablet 5-10 mg (has no administration in time range)  HYDROmorphone (DILAUDID) injection 0.5-1 mg (has no administration in time range)  methocarbamol (ROBAXIN) tablet 500 mg (has no administration in time range)  diphenhydrAMINE (BENADRYL) capsule 25 mg (has no administration in time range)    Or  diphenhydrAMINE (BENADRYL) injection 25 mg (has no administration in time range)  docusate sodium (COLACE) capsule 100 mg (has no administration in time range)  bisacodyl (DULCOLAX) suppository 10 mg (has no administration in time range)  ondansetron (ZOFRAN-ODT) disintegrating tablet 4 mg (has no administration in time range)    Or  ondansetron (ZOFRAN) injection 4 mg (has no administration in time range)   metoprolol tartrate (LOPRESSOR) injection 5 mg (has no administration in time range)  hydrALAZINE (APRESOLINE) injection 10 mg (has no administration in time range)  gabapentin (NEURONTIN) capsule 300 mg (  has no administration in time range)  morphine 4 MG/ML injection 4 mg (4 mg Intravenous Given 11/11/18 2114)  sodium chloride 0.9 % bolus 1,000 mL (1,000 mLs Intravenous Bolus from Bag 11/11/18 2114)  iohexol (OMNIPAQUE) 300 MG/ML solution 100 mL (100 mLs Intravenous Contrast Given 11/11/18 2304)  morphine 4 MG/ML injection 4 mg (4 mg Intravenous Given 11/11/18 2352)    Mobility walks Low fall risk   Focused Assessments Pulmonary Assessment Handoff:  Lung sounds:   O2 Device: Room Air        R Recommendations: See Admitting Provider Note  Report given to:   Additional Notes:

## 2018-11-12 NOTE — Progress Notes (Signed)
Patient has been discharged and left via transportation system.

## 2018-11-12 NOTE — Progress Notes (Signed)
Discharge paperwork and instructions given to pt. Pt not in distress and tolerated well. 

## 2018-11-12 NOTE — H&P (Signed)
Surgical H&P  CC: abdominal pain  HPI: This is an otherwise healthy 28 year old man who presented to the emergency room this evening with somewhat acute onset right lower quadrant abdominal pain that began at approximately 1 PM.  It has progressively worsened and the pain radiates into his right groin and testicle.  No associated nausea, vomiting, diarrhea, or fevers.  No prior similar symptoms. No prior abdominal surgery.  He works as a [employer]. Denies tobacco, alcohol or drug use.  Allergies  Allergen Reactions  . Shellfish Allergy Other (See Comments)    SOB, hives, swelling    Past Medical History:  Diagnosis Date  . Asthma     History reviewed. No pertinent surgical history.  No family history on file.  Social History   Socioeconomic History  . Marital status: Single    Spouse name: Not on file  . Number of children: Not on file  . Years of education: Not on file  . Highest education level: Not on file  Occupational History  . Not on file  Social Needs  . Financial resource strain: Not on file  . Food insecurity:    Worry: Not on file    Inability: Not on file  . Transportation needs:    Medical: Not on file    Non-medical: Not on file  Tobacco Use  . Smoking status: Never Smoker  . Smokeless tobacco: Never Used  Substance and Sexual Activity  . Alcohol use: No  . Drug use: No  . Sexual activity: Yes    Birth control/protection: Condom  Lifestyle  . Physical activity:    Days per week: Not on file    Minutes per session: Not on file  . Stress: Not on file  Relationships  . Social connections:    Talks on phone: Not on file    Gets together: Not on file    Attends religious service: Not on file    Active member of club or organization: Not on file    Attends meetings of clubs or organizations: Not on file    Relationship status: Not on file  Other Topics Concern  . Not on file  Social History Narrative  . Not on file    No current  facility-administered medications on file prior to encounter.    Current Outpatient Medications on File Prior to Encounter  Medication Sig Dispense Refill  . ibuprofen (ADVIL,MOTRIN) 200 MG tablet Take 400 mg by mouth every 6 (six) hours as needed for pain (pain).    Marland Kitchen HYDROcodone-acetaminophen (NORCO/VICODIN) 5-325 MG tablet Take 1 tablet by mouth every 4 (four) hours as needed. (Patient not taking: Reported on 11/11/2018) 20 tablet 0    Review of Systems: a complete, 10pt review of systems was completed with pertinent positives and negatives as documented in the HPI  Physical Exam: Vitals:   11/11/18 2130 11/11/18 2230  BP: 116/65 118/76  Pulse: (!) 49 (!) 56  Resp:    Temp:    SpO2: 98% 99%   Gen: A&Ox3, no distress but cannot find a comfortable position on the stretcher Head: normocephalic, atraumatic Eyes: extraocular motions intact, anicteric.  Neck: supple without mass or thyromegaly Chest: unlabored respirations, symmetrical air entry, clear bilaterally   Cardiovascular: RRR with palpable distal pulses, no pedal edema Abdomen: soft, nondistended, tender in suprapubic and RLQ with localized peritonitis/ involuntary guarding in the RLQ. No mass or organomegaly.  Extremities: warm, without edema, no deformities  Neuro: grossly intact Psych: appropriate mood and  affect, normal insight  Skin: warm and dry   CBC Latest Ref Rng & Units 11/11/2018  WBC 4.0 - 10.5 K/uL 5.5  Hemoglobin 13.0 - 17.0 g/dL 28.414.7  Hematocrit 13.239.0 - 52.0 % 44.9  Platelets 150 - 400 K/uL 103(L)    CMP Latest Ref Rng & Units 11/11/2018  Glucose 70 - 99 mg/dL 440(N125(H)  BUN 6 - 20 mg/dL 18  Creatinine 0.270.61 - 2.531.24 mg/dL 6.641.15  Sodium 403135 - 474145 mmol/L 138  Potassium 3.5 - 5.1 mmol/L 4.3  Chloride 98 - 111 mmol/L 101  CO2 22 - 32 mmol/L 24  Calcium 8.9 - 10.3 mg/dL 9.4  Total Protein 6.5 - 8.1 g/dL 7.5  Total Bilirubin 0.3 - 1.2 mg/dL 0.9  Alkaline Phos 38 - 126 U/L 55  AST 15 - 41 U/L 23  ALT 0 - 44 U/L  35    No results found for: INR, PROTIME  Imaging: Ct Abdomen Pelvis W Contrast  Result Date: 11/11/2018 CLINICAL DATA:  Initial evaluation for acute hypogastric abdominal pain, appendicitis suspected. EXAM: CT ABDOMEN AND PELVIS WITH CONTRAST TECHNIQUE: Multidetector CT imaging of the abdomen and pelvis was performed using the standard protocol following bolus administration of intravenous contrast. CONTRAST:  100mL OMNIPAQUE IOHEXOL 300 MG/ML  SOLN COMPARISON:  None. FINDINGS: Lower chest: Visualized lung bases are clear. Hepatobiliary: Liver demonstrates a normal contrast enhanced appearance. Gallbladder within normal limits. No biliary dilatation. Pancreas: Pancreas within normal limits. Spleen: Spleen within normal limits. Adrenals/Urinary Tract: Adrenal glands are normal. Kidneys equal in size with symmetric enhancement. No nephrolithiasis, hydronephrosis, or focal enhancing renal mass. No hydroureter. Partially distended bladder within normal limits. Stomach/Bowel: Stomach within normal limits. No evidence for bowel obstruction. Appendix seen extending inferiorly from the cecum (series 6, image 56). Appendix is dilated up to 11 mm in diameter. Multiple small calcified appendicoliths present within the appendiceal lumen. Minimal mucosal enhancement with question of subtle early periappendiceal fat stranding. Findings could reflect sequelae of mild and/or early appendicitis. No loculated collections or evidence for perforation. No other acute inflammatory changes seen about the bowels. Vascular/Lymphatic: Normal intravascular enhancement seen throughout the intra-abdominal aorta and its branch vessels. No adenopathy. Reproductive: Prostate normal. Other: No free air or fluid. Musculoskeletal: No acute osseous finding. No discrete lytic or blastic osseous lesions. IMPRESSION: 1. Dilatation of the appendix up to 11 mm in diameter with multiple internal calcified appendicoliths band question of subtle  periappendiceal fat stranding. Findings could reflect sequelae for mild and/or early acute appendicitis. No other complicating features identified. 2. No other acute intra-abdominal or pelvic process. Electronically Signed   By: Rise MuBenjamin  McClintock M.D.   On: 11/11/2018 23:21   [place] Scrotum W/doppler  Result Date: 11/11/2018 CLINICAL DATA:  Initial evaluation for acute testicular pain radiating to abdomen. EXAM: SCROTAL ULTRASOUND DOPPLER ULTRASOUND OF THE TESTICLES TECHNIQUE: Complete ultrasound examination of the testicles, epididymis, and other scrotal structures was performed. Color and spectral Doppler ultrasound were also utilized to evaluate blood flow to the testicles. COMPARISON:  None. FINDINGS: Right testicle Measurements: 3.9 x 1.6 x 2.6 cm. No mass or microlithiasis visualized. Left testicle Measurements: 3.7 x 1.7 x 2.6 cm. No mass or microlithiasis visualized. Right epididymis:  Normal in size and appearance. Left epididymis:  Normal in size and appearance. Hydrocele:  None visualized. Varicocele:  Small left-sided varicocele. Pulsed Doppler interrogation of both testes demonstrates normal low resistance arterial and venous waveforms bilaterally. IMPRESSION: 1. Small left-sided varicocele. 2. Otherwise unremarkable and normal scrotal ultrasound. No  evidence for torsion or other acute finding. Electronically Signed   By: Rise Mu M.D.   On: 11/11/2018 22:41     A/P: 27yo man with acute appendicitis. I recommend proceeding with laparoscopic appendectomy. We discussed the surgery including risks of bleeding, infection, pain, scarring, injury to intra-abdominal structures, conversion to open surgery or more extensive resection, risk of staple line leak or delayed abscess, failure to resolve symptoms, postoperative ileus, incisional hernia, as well as general risks of DVT/PE, pneumonia, stroke, heart attack, death. Questions were welcomed and answered to the patient's satisfaction. His  mother was updated by phone at the bedside. Will plan for surgery this morning with Dr. Andrey Campanile.    Phylliss Blakes, MD [facility], [place] Pager [phone]

## 2018-11-13 ENCOUNTER — Encounter (HOSPITAL_COMMUNITY): Payer: Self-pay | Admitting: General Surgery

## 2018-11-13 NOTE — Anesthesia Postprocedure Evaluation (Signed)
Anesthesia Post Note  Patient: Daniel Potts  Procedure(s) Performed: APPENDECTOMY LAPAROSCOPIC (N/A Abdomen)     Patient location during evaluation: PACU Anesthesia Type: General Level of consciousness: awake and alert Pain management: pain level controlled Vital Signs Assessment: post-procedure vital signs reviewed and stable Respiratory status: spontaneous breathing, nonlabored ventilation, respiratory function stable and patient connected to nasal cannula oxygen Cardiovascular status: blood pressure returned to baseline and stable Postop Assessment: no apparent nausea or vomiting Anesthetic complications: no    Last Vitals:  Vitals:   11/12/18 1157 11/12/18 1625  BP: 120/81 115/72  Pulse: 83 77  Resp: 16 16  Temp: 37.1 C 37.1 C  SpO2: 99% 99%    Last Pain:  Vitals:   11/12/18 1625  TempSrc: Oral  PainSc:                  Marco Adelson S

## 2019-11-19 ENCOUNTER — Ambulatory Visit: Payer: Medicaid Other | Attending: Internal Medicine

## 2019-11-19 DIAGNOSIS — Z23 Encounter for immunization: Secondary | ICD-10-CM

## 2019-11-19 NOTE — Progress Notes (Signed)
   Covid-19 Vaccination Clinic  Name:  Daniel Potts    MRN: [id number] DOB: 1990-11-30  11/19/2019  [name] was observed post Covid-19 immunization for 15 minutes without incident. He was provided with Vaccine Information Sheet and instruction to access the V-Safe system.   [name] was instructed to call 911 with any severe reactions post vaccine: Marland Kitchen Difficulty breathing  . Swelling of face and throat  . A fast heartbeat  . A bad rash all over body  . Dizziness and weakness   Immunizations Administered    Name Date Dose VIS Date Route   Pfizer COVID-19 Vaccine 11/19/2019  1:03 PM 0.3 mL 07/17/2019 Intramuscular   Manufacturer: [employer], [employer]   Lot: [id number]   NDC: [address]-0

## 2019-12-14 ENCOUNTER — Ambulatory Visit: Payer: Medicaid Other | Attending: Internal Medicine

## 2019-12-14 DIAGNOSIS — Z23 Encounter for immunization: Secondary | ICD-10-CM

## 2019-12-14 NOTE — Progress Notes (Signed)
   Covid-19 Vaccination Clinic  Name:  JEROMIAH OHALLORAN    MRN: [id number] DOB: 1990/08/22  12/14/2019  Mr. Daniello was observed post Covid-19 immunization for 15 minutes without incident. He was provided with Vaccine Information Sheet and instruction to access the V-Safe system.   Mr. Fariss was instructed to call 911 with any severe reactions post vaccine: Marland Kitchen Difficulty breathing  . Swelling of face and throat  . A fast heartbeat  . A bad rash all over body  . Dizziness and weakness   Immunizations Administered    Name Date Dose VIS Date Route   Pfizer COVID-19 Vaccine 12/14/2019  1:48 PM 0.3 mL 09/30/2018 Intramuscular   Manufacturer: [employer], [employer]   Lot: [id number]   NDC: [address]-6

## 2019-12-17 IMAGING — CT CT ABDOMEN AND PELVIS WITH CONTRAST
2 of 5 series · 16 of 46 positions shown, 18 images · IV contrast (Omni 300)
Comparison: None.

CLINICAL DATA: Initial evaluation for acute hypogastric abdominal
pain, appendicitis suspected.

EXAM:
CT ABDOMEN AND PELVIS WITH CONTRAST
TECHNIQUE: Multidetector CT imaging of the abdomen and pelvis was performed
using the standard protocol following bolus administration of
intravenous contrast.
CONTRAST:  100mL OMNIPAQUE IOHEXOL 300 MG/ML  SOLN

[Series 3: a/p w/ 5mm · axial · 0.78mm/px · z∈[+724,+1159]mm · 13 of 97 slices shown, 15 images]
[im 5/97  soft-tissue]
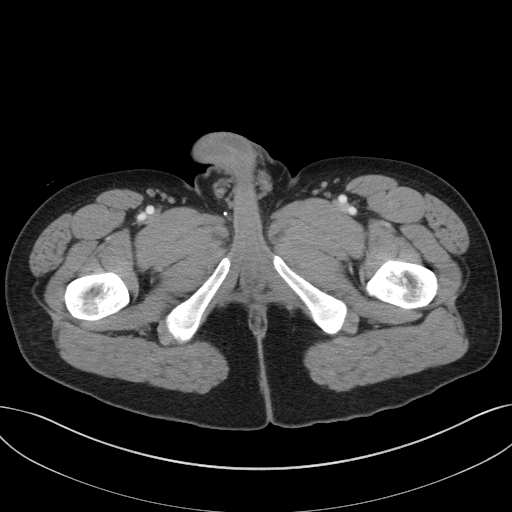
[im 5/97  bone]
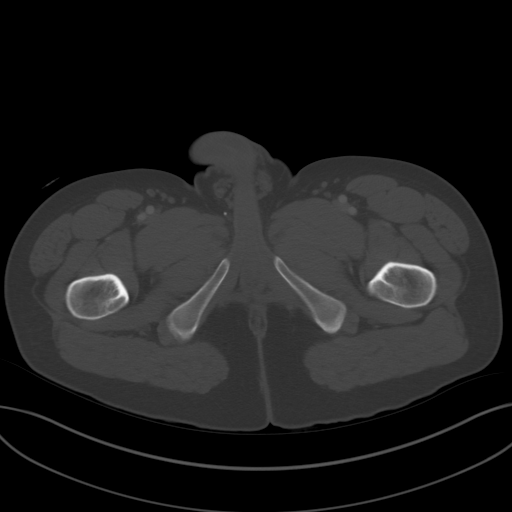
[im 15/97  soft-tissue]
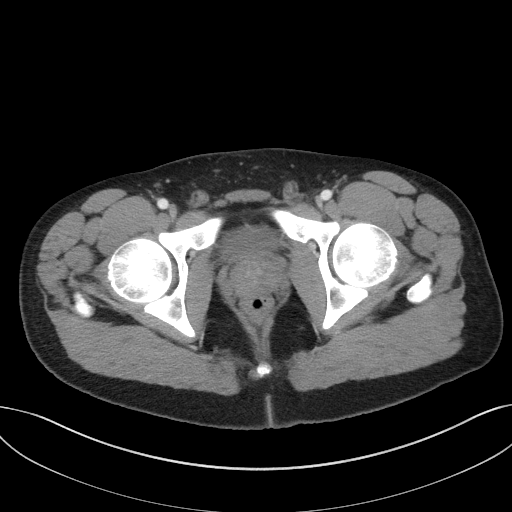
[im 20/97  soft-tissue]
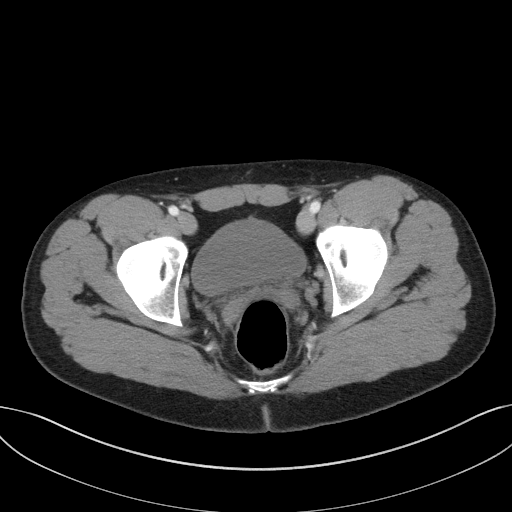
[im 29/97  soft-tissue]
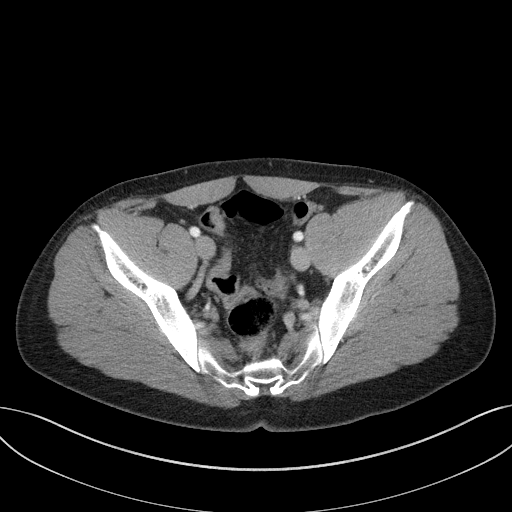
[im 34/97  soft-tissue]
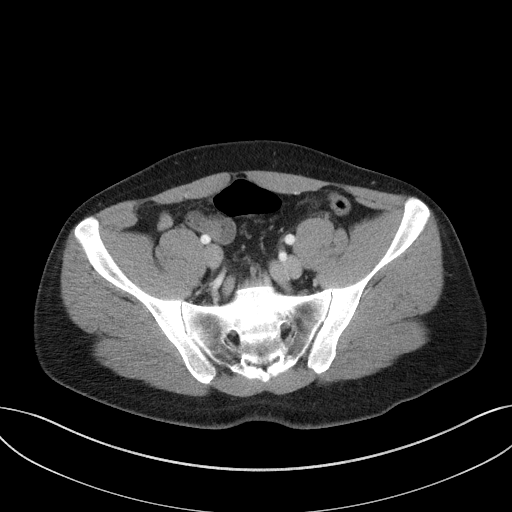
[im 44/97  soft-tissue]
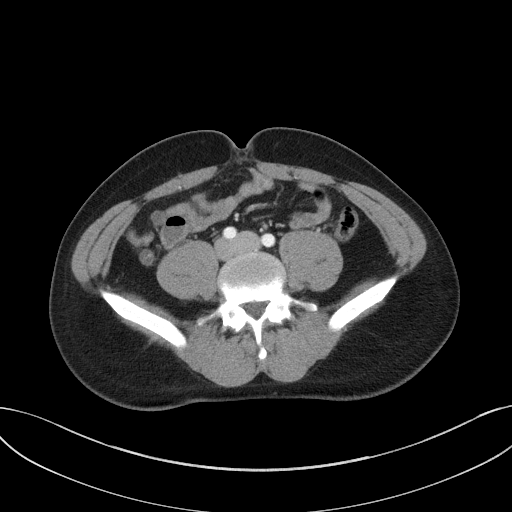
[im 49/97  soft-tissue]
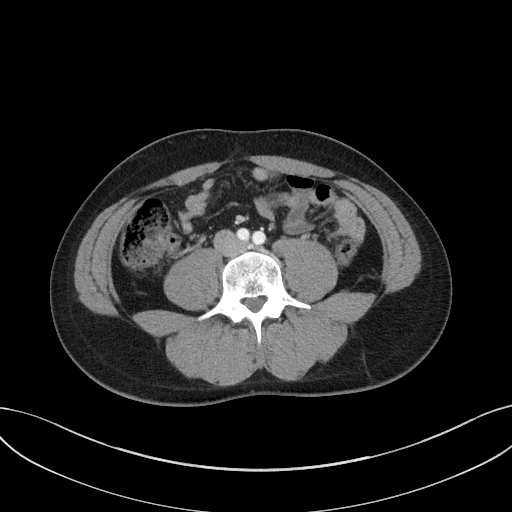
[im 53/97  soft-tissue]
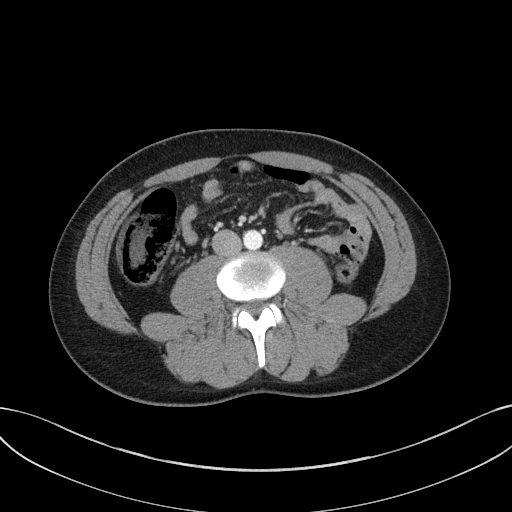
[im 63/97  soft-tissue]
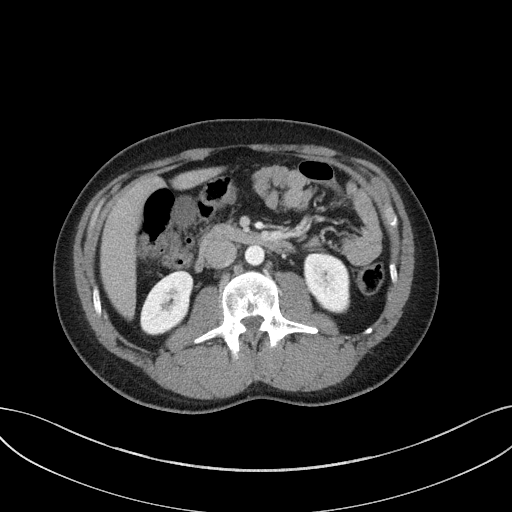
[im 63/97  bone]
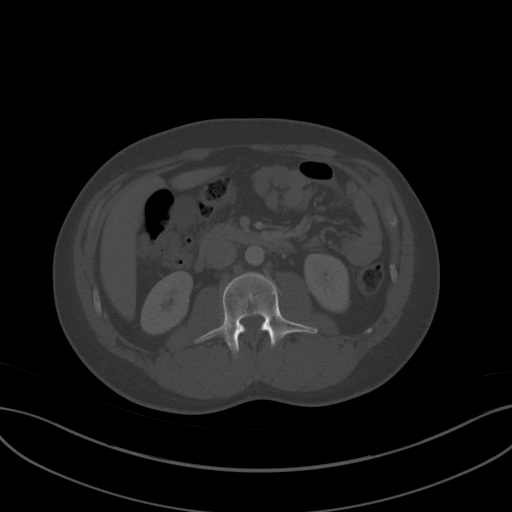
[im 68/97  soft-tissue]
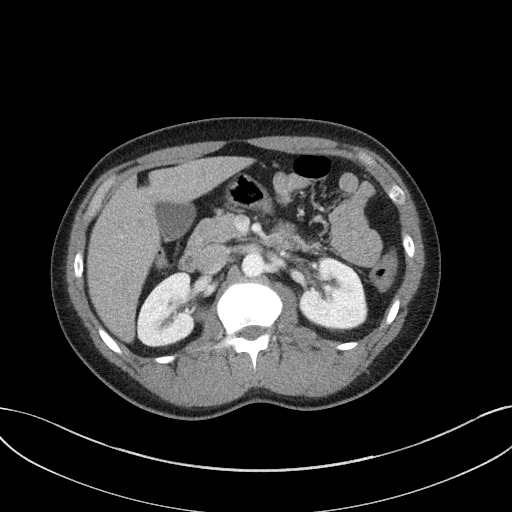
[im 77/97  soft-tissue]
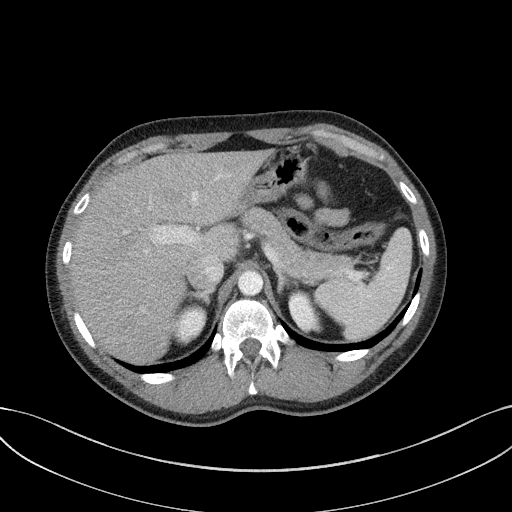
[im 82/97  soft-tissue]
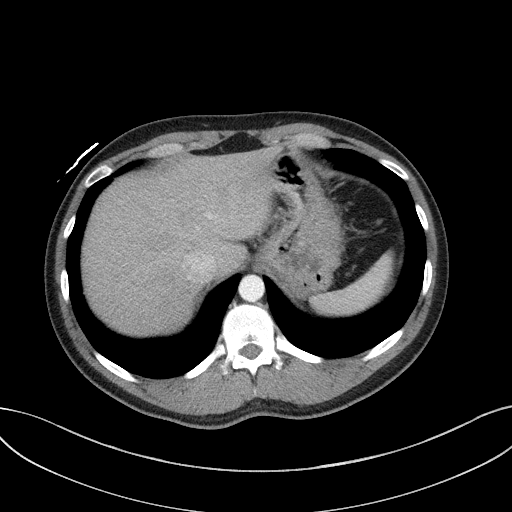
[im 92/97  soft-tissue]
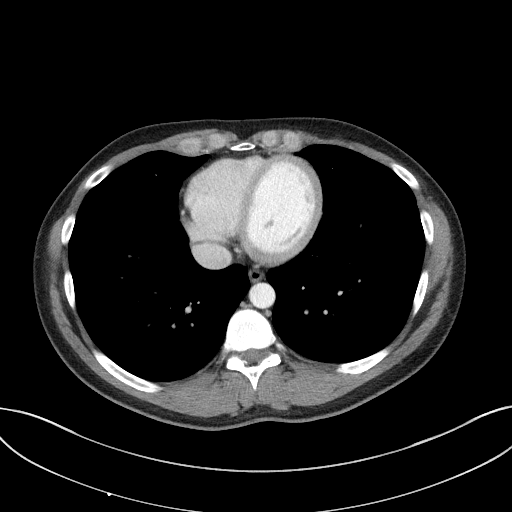

[Series 6: a/p w/ cor · coronal · 0.81mm/px · 3 of 146 slices shown]
[im 49/146  soft-tissue]
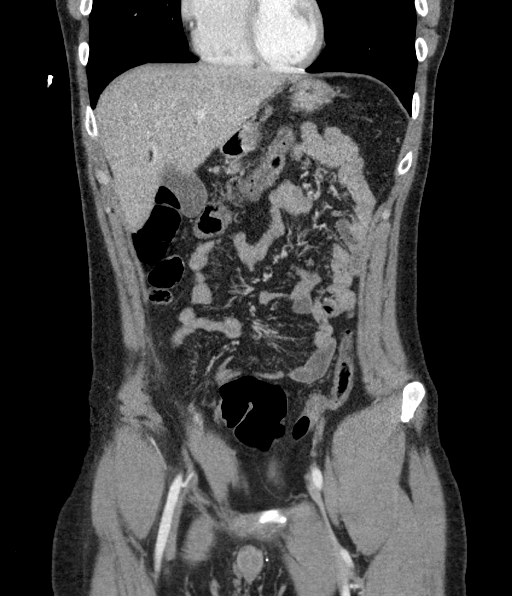
[im 65/146  soft-tissue]
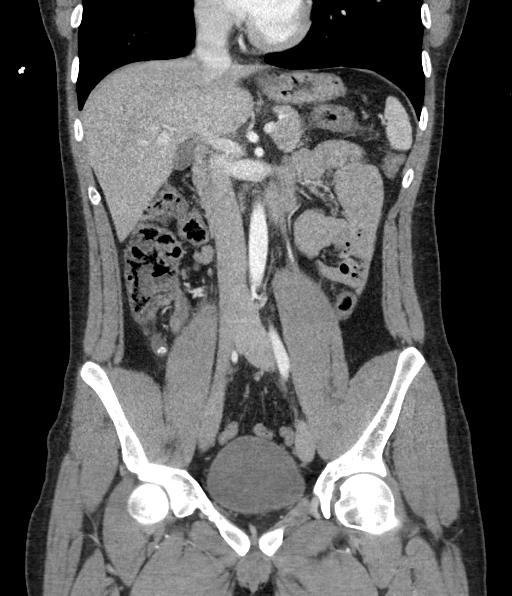
[im 81/146  soft-tissue]
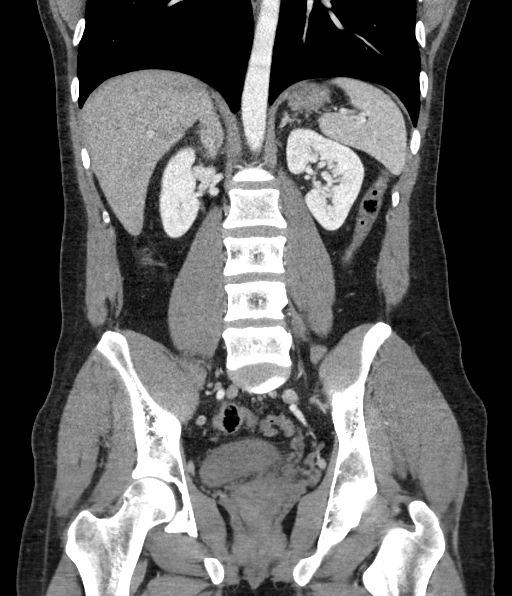

[16 of 46 positions shown; findings below may reference images not displayed]

FINDINGS: Lower chest: Visualized lung bases are clear.

Hepatobiliary: Liver demonstrates a normal contrast enhanced
appearance. Gallbladder within normal limits. No biliary dilatation.

Pancreas: Pancreas within normal limits.

Spleen: Spleen within normal limits.

Adrenals/Urinary Tract: Adrenal glands are normal. Kidneys equal in
size with symmetric enhancement. No nephrolithiasis, hydronephrosis,
or focal enhancing renal mass. No hydroureter. Partially distended
bladder within normal limits.

Stomach/Bowel: Stomach within normal limits. No evidence for bowel
obstruction. Appendix seen extending inferiorly from the cecum
(series 6, image 56). Appendix is dilated up to 11 mm in diameter.
Multiple small calcified appendicoliths present within the
appendiceal lumen. Minimal mucosal enhancement with question of
subtle early periappendiceal fat stranding. Findings could reflect
sequelae of mild and/or early appendicitis. No loculated collections
or evidence for perforation.

No other acute inflammatory changes seen about the bowels.

Vascular/Lymphatic: Normal intravascular enhancement seen throughout
the intra-abdominal aorta and its branch vessels. No adenopathy.

Reproductive: Prostate normal.

Other: No free air or fluid.

Musculoskeletal: No acute osseous finding. No discrete lytic or
blastic osseous lesions.
IMPRESSION: 1. Dilatation of the appendix up to 11 mm in diameter with multiple
internal calcified appendicoliths band question of subtle
periappendiceal fat stranding. Findings could reflect sequelae for
mild and/or early acute appendicitis. No other complicating features
identified.
2. No other acute intra-abdominal or pelvic process.

## 2022-11-28 ENCOUNTER — Encounter (HOSPITAL_COMMUNITY): Payer: Self-pay

## 2022-11-28 ENCOUNTER — Emergency Department (HOSPITAL_COMMUNITY)
Admission: EM | Admit: 2022-11-28 | Discharge: 2022-11-28 | Disposition: A | Discharge status: Home / Self Care | Payer: 59 | Attending: Emergency Medicine | Admitting: Emergency Medicine

## 2022-11-28 ENCOUNTER — Emergency Department (HOSPITAL_COMMUNITY): Payer: 59

## 2022-11-28 ENCOUNTER — Other Ambulatory Visit: Payer: Self-pay

## 2022-11-28 DIAGNOSIS — M542 Cervicalgia: Secondary | ICD-10-CM | POA: Diagnosis not present

## 2022-11-28 DIAGNOSIS — R55 Syncope and collapse: Secondary | ICD-10-CM | POA: Diagnosis not present

## 2022-11-28 DIAGNOSIS — Y92410 Unspecified street and highway as the place of occurrence of the external cause: Secondary | ICD-10-CM | POA: Insufficient documentation

## 2022-11-28 DIAGNOSIS — Z041 Encounter for examination and observation following transport accident: Secondary | ICD-10-CM | POA: Diagnosis not present

## 2022-11-28 MED ORDER — METHOCARBAMOL 500 MG PO TABS: 500.0000 mg | ORAL_TABLET | Freq: Two times a day (BID) | ORAL | 0 refills | Status: AC

## 2022-11-28 MED ORDER — NAPROXEN 500 MG PO TABS: 500.0000 mg | ORAL_TABLET | Freq: Two times a day (BID) | ORAL | 0 refills | Status: DC
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
## 2022-11-29 ENCOUNTER — Encounter: Payer: Self-pay | Admitting: Physical Medicine and Rehabilitation
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
## 2022-12-12 ENCOUNTER — Encounter: Payer: Self-pay | Admitting: Physical Medicine and Rehabilitation

## 2022-12-12 ENCOUNTER — Encounter: Payer: 59 | Attending: Physical Medicine and Rehabilitation | Admitting: Physical Medicine and Rehabilitation

## 2022-12-12 VITALS — BP 122/81 | HR 60 | Ht 74.0 in | Wt 193.0 lb

## 2022-12-12 DIAGNOSIS — S060X9S Concussion with loss of consciousness of unspecified duration, sequela: Secondary | ICD-10-CM | POA: Diagnosis not present

## 2022-12-12 DIAGNOSIS — M542 Cervicalgia: Secondary | ICD-10-CM | POA: Insufficient documentation

## 2022-12-12 DIAGNOSIS — S060X9A Concussion with loss of consciousness of unspecified duration, initial encounter: Secondary | ICD-10-CM | POA: Insufficient documentation

## 2022-12-12 DIAGNOSIS — R519 Headache, unspecified: Secondary | ICD-10-CM | POA: Diagnosis not present

## 2022-12-12 DIAGNOSIS — G479 Sleep disorder, unspecified: Secondary | ICD-10-CM | POA: Diagnosis not present
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
## 2022-12-25 NOTE — Progress Notes (Deleted)
Subjective:    Patient ID: Daniel Potts, male    DOB: 1991/05/23, 32 y.o.   MRN: [id number]  HPI   Pain Inventory Average Pain {NUMBERS; 0-10:5044} Pain Right Now {NUMBERS; 0-10:5044} My pain is {PAIN DESCRIPTION:21022940}  In the last 24 hours, has pain interfered with the following? General activity {NUMBERS; 0-10:5044} Relation with others {NUMBERS; 0-10:5044} Enjoyment of life {NUMBERS; 0-10:5044} What TIME of day is your pain at its worst? {time of day:24191} Sleep (in general) {BHH GOOD/FAIR/POOR:22877}  Pain is worse with: {ACTIVITIES:21022942} Pain improves with: {PAIN IMPROVES WUJW:11914782} Relief from Meds: {NUMBERS; 0-10:5044}  No family history on file. Social History   Socioeconomic History   Marital status: Single    Spouse name: Not on file   Number of children: Not on file   Years of education: Not on file   Highest education level: Not on file  Occupational History   Not on file  Tobacco Use   Smoking status: Never   Smokeless tobacco: Never  Vaping Use   Vaping Use: Never used  Substance and Sexual Activity   Alcohol use: No   Drug use: No   Sexual activity: Yes    Birth control/protection: Condom  Other Topics Concern   Not on file  Social History Narrative   Not on file   Social Determinants of Health   Financial Resource Strain: Unknown (11/12/2018)   Overall Financial Resource Strain (CARDIA)    Difficulty of Paying Living Expenses: Patient declined  Food Insecurity: Unknown (11/12/2018)   Hunger Vital Sign    Worried About Running Out of Food in the Last Year: Patient declined    Ran Out of Food in the Last Year: Patient declined  Transportation Needs: No Transportation Needs (11/12/2018)   PRAPARE - Administrator, Civil Service (Medical): No    Lack of Transportation (Non-Medical): No  Physical Activity: Insufficiently Active (11/12/2018)   Exercise Vital Sign    Days of Exercise per Week: 3 days    Minutes of Exercise  per Session: 30 min  Stress: No Stress Concern Present (11/12/2018)   [employer] of Occupational Health - Occupational Stress Questionnaire    Feeling of Stress : Only a little  Social Connections: Unknown (11/12/2018)   Social Connection and Isolation Panel [NHANES]    Frequency of Communication with Friends and Family: Patient declined    Frequency of Social Gatherings with Friends and Family: Patient declined    Attends Religious Services: Patient declined    Database administrator or Organizations: Patient declined    Attends Banker Meetings: Patient declined    Marital Status: Patient declined   Past Surgical History:  Procedure Laterality Date   LAPAROSCOPIC APPENDECTOMY N/A 11/12/2018   Procedure: APPENDECTOMY LAPAROSCOPIC;  Surgeon: Gaynelle Adu, MD;  Location: [facility] OR;  Service: General;  Laterality: N/A;   Past Surgical History:  Procedure Laterality Date   LAPAROSCOPIC APPENDECTOMY N/A 11/12/2018   Procedure: APPENDECTOMY LAPAROSCOPIC;  Surgeon: Gaynelle Adu, MD;  Location: [facility] OR;  Service: General;  Laterality: N/A;   Past Medical History:  Diagnosis Date   Asthma    There were no vitals taken for this visit.  Opioid Risk Score:   Fall Risk Score:  `1  Depression screen [facility] 2/9     12/12/2022   10:35 AM  Depression screen PHQ 2/9  Decreased Interest 0  Down, Depressed, Hopeless 0  PHQ - 2 Score 0  Altered sleeping 3  Tired, decreased  energy 1  Change in appetite 0  Feeling bad or failure about yourself  1  Trouble concentrating 0  Moving slowly or fidgety/restless 0  Suicidal thoughts 0  PHQ-9 Score 5    Review of Systems     Objective:   Physical Exam        Assessment & Plan:
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
## 2022-12-26 ENCOUNTER — Encounter (HOSPITAL_BASED_OUTPATIENT_CLINIC_OR_DEPARTMENT_OTHER): Payer: 59 | Admitting: Physical Medicine and Rehabilitation

## 2022-12-26 ENCOUNTER — Encounter: Payer: Self-pay | Admitting: Physical Medicine and Rehabilitation

## 2022-12-26 VITALS — Ht 74.0 in | Wt 191.0 lb

## 2022-12-26 DIAGNOSIS — S060X9S Concussion with loss of consciousness of unspecified duration, sequela: Secondary | ICD-10-CM | POA: Diagnosis not present

## 2022-12-26 DIAGNOSIS — R519 Headache, unspecified: Secondary | ICD-10-CM | POA: Diagnosis not present

## 2022-12-26 DIAGNOSIS — G479 Sleep disorder, unspecified: Secondary | ICD-10-CM

## 2022-12-26 DIAGNOSIS — M542 Cervicalgia: Secondary | ICD-10-CM

## 2022-12-26 MED ORDER — NAPROXEN 500 MG PO TABS: 500.0000 mg | ORAL_TABLET | Freq: Every day | ORAL | 0 refills | Status: AC

## 2022-12-26 MED ORDER — DICLOFENAC SODIUM 1 % EX GEL: 2.0000 g | Freq: Two times a day (BID) | CUTANEOUS | 0 refills | Status: AC

## 2022-12-26 MED ORDER — METHOCARBAMOL 750 MG PO TABS: 750.0000 mg | ORAL_TABLET | Freq: Three times a day (TID) | ORAL | 2 refills | Status: AC | PRN

## 2022-12-26 NOTE — Patient Instructions (Signed)
  I have referred you to PT to work on your neck  I am getting an xray of your neck to look for arthritis and possible areas of nerve pinching on the left  Take voltaren gel over the counter twice daily for at least 2 weeks. You can also use robaxin as needed up to 3 times daily and Tylenol 1 tablet up to three times daily for adjunctive control.   Follow up with me in 1 month  Difficulty sleeping Try to get sleep at a consistent time every day, in 6-7 hour intervals uninterrupted if possible  Take naprosyn consistently before bedtime.

## 2023-01-03 ENCOUNTER — Ambulatory Visit: Payer: 59

## 2023-01-07 ENCOUNTER — Encounter: Payer: Self-pay | Admitting: Physical Therapy

## 2023-01-07 ENCOUNTER — Ambulatory Visit: Payer: 59 | Attending: Orthopedic Surgery | Admitting: Physical Therapy

## 2023-01-07 ENCOUNTER — Other Ambulatory Visit: Payer: Self-pay

## 2023-01-07 DIAGNOSIS — R519 Headache, unspecified: Secondary | ICD-10-CM | POA: Insufficient documentation

## 2023-01-07 DIAGNOSIS — R293 Abnormal posture: Secondary | ICD-10-CM | POA: Insufficient documentation

## 2023-01-07 DIAGNOSIS — S060X9S Concussion with loss of consciousness of unspecified duration, sequela: Secondary | ICD-10-CM | POA: Insufficient documentation

## 2023-01-07 DIAGNOSIS — R29898 Other symptoms and signs involving the musculoskeletal system: Secondary | ICD-10-CM | POA: Diagnosis not present
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
## 2023-01-23 ENCOUNTER — Encounter: Payer: Self-pay | Admitting: Physical Medicine and Rehabilitation

## 2023-01-23 ENCOUNTER — Encounter: Payer: 59 | Attending: Physical Medicine and Rehabilitation | Admitting: Physical Medicine and Rehabilitation

## 2023-01-23 VITALS — BP 117/77 | HR 57 | Ht 74.0 in | Wt 193.8 lb

## 2023-01-23 DIAGNOSIS — S060X9S Concussion with loss of consciousness of unspecified duration, sequela: Secondary | ICD-10-CM

## 2023-01-23 DIAGNOSIS — R519 Headache, unspecified: Secondary | ICD-10-CM | POA: Diagnosis not present

## 2023-01-23 DIAGNOSIS — G479 Sleep disorder, unspecified: Secondary | ICD-10-CM | POA: Diagnosis not present

## 2023-01-23 NOTE — Patient Instructions (Signed)
Return to play only if symptoms do not recur, and increase physical activity gradually Come back as needed if symptoms return. You cna continue to use voltaren Over the counter as needed.
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
## 2023-07-25 ENCOUNTER — Emergency Department (HOSPITAL_COMMUNITY): Payer: 59

## 2023-07-25 ENCOUNTER — Emergency Department (HOSPITAL_COMMUNITY)
Admission: EM | Admit: 2023-07-25 | Discharge: 2023-07-25 | Disposition: A | Discharge status: Home / Self Care | Payer: 59 | Attending: Emergency Medicine | Admitting: Emergency Medicine

## 2023-07-25 ENCOUNTER — Other Ambulatory Visit: Payer: Self-pay

## 2023-07-25 ENCOUNTER — Encounter (HOSPITAL_COMMUNITY): Payer: Self-pay | Admitting: *Deleted

## 2023-07-25 DIAGNOSIS — W500XXA Accidental hit or strike by another person, initial encounter: Secondary | ICD-10-CM | POA: Diagnosis not present

## 2023-07-25 DIAGNOSIS — Y9367 Activity, basketball: Secondary | ICD-10-CM | POA: Diagnosis not present

## 2023-07-25 DIAGNOSIS — S63285A Dislocation of proximal interphalangeal joint of left ring finger, initial encounter: Secondary | ICD-10-CM | POA: Diagnosis not present

## 2023-07-25 DIAGNOSIS — S63259A Unspecified dislocation of unspecified finger, initial encounter: Secondary | ICD-10-CM

## 2023-07-25 DIAGNOSIS — S63283A Dislocation of proximal interphalangeal joint of left middle finger, initial encounter: Secondary | ICD-10-CM | POA: Diagnosis not present

## 2023-07-25 DIAGNOSIS — S6992XA Unspecified injury of left wrist, hand and finger(s), initial encounter: Secondary | ICD-10-CM | POA: Diagnosis not present

## 2023-07-25 DIAGNOSIS — Z9889 Other specified postprocedural states: Secondary | ICD-10-CM | POA: Diagnosis not present

## 2023-07-25 MED ORDER — OXYCODONE-ACETAMINOPHEN 5-325 MG PO TABS
1.0000 | ORAL_TABLET | Freq: Once | ORAL | Status: AC
  Administered 2023-07-25: 1 via ORAL
  Filled 2023-07-25: qty 1

## 2023-07-25 MED ORDER — LIDOCAINE HCL (PF) 1 % IJ SOLN
5.0000 mL | Freq: Once | INTRAMUSCULAR | Status: AC
  Administered 2023-07-25: 5 mL
  Filled 2023-07-25: qty 5

## 2023-07-25 NOTE — ED Triage Notes (Signed)
The pt was playing basketball  a little while ago he has a  deformity of the lt ring finger

## 2023-07-25 NOTE — Discharge Instructions (Signed)
Remain in finger splint until seen by Dr. Janee Morn.  Please call Dr. Carollee Massed office in the morning to schedule an appointment.  You may take ibuprofen or Tylenol in the interim.  Elevate your hand to reduce swelling.  Apply ice as needed.

## 2023-07-25 NOTE — ED Provider Notes (Signed)
[place] EMERGENCY DEPARTMENT AT [facility] Provider Note   CSN: [id number] Arrival date & time: 07/25/23  0125     History  Chief Complaint  Patient presents with   Finger Injury    DELOY Potts is a 32 y.o. male who presents to the ED for evaluation of dislocation of the left ring finger.  Reports he was playing basketball when it slammed up against another person.  Denies numbness or tingling to his left finger.  Denies any history of operations to his left hand.  Denies medications prior to arrival.  Patient does have obvious dislocation of left ring finger.  HPI     Home Medications Prior to Admission medications   Medication Sig Start Date End Date Taking? Authorizing Provider  acetaminophen (TYLENOL) 500 MG tablet Take 2 tablets (1,000 mg total) by mouth every 8 (eight) hours as needed for mild pain. 11/12/18   Juliet Rude, PA-C  diclofenac Sodium (VOLTAREN ARTHRITIS PAIN) 1 % GEL Apply 2 g topically 2 (two) times daily. Apply to neck and shoulder twice daily for at least 2 weeks 12/26/22   Angelina Sheriff, DO  methocarbamol (ROBAXIN) 500 MG tablet Take 1 tablet (500 mg total) by mouth 2 (two) times daily. 11/28/22   Darrick Grinder, PA-C  methocarbamol (ROBAXIN-750) 750 MG tablet Take 1 tablet (750 mg total) by mouth every 8 (eight) hours as needed for muscle spasms. 12/26/22   Elijah Birk C, DO  naproxen (NAPROSYN) 500 MG tablet Take 1 tablet (500 mg total) by mouth at bedtime. 12/26/22   Angelina Sheriff, DO      Allergies    Shellfish allergy    Review of Systems   Review of Systems  Musculoskeletal:  Positive for arthralgias.  All other systems reviewed and are negative.   Physical Exam Updated Vital Signs BP (!) 130/96 (BP Location: Right Arm)   Pulse 95   Temp 98.4 F (36.9 C)   Resp 16   Ht 6\' 2"  (1.88 m)   Wt 87.9 kg   SpO2 97%   BMI 24.88 kg/m  Physical Exam Vitals and nursing note reviewed.  Constitutional:      General:  He is not in acute distress.    Appearance: Normal appearance. He is not ill-appearing, toxic-appearing or diaphoretic.  HENT:     Head: Normocephalic and atraumatic.     Mouth/Throat:     Mouth: Mucous membranes are moist.     Pharynx: Oropharynx is clear.  Eyes:     Extraocular Movements: Extraocular movements intact.     Conjunctiva/sclera: Conjunctivae normal.     Pupils: Pupils are equal, round, and reactive to light.  Cardiovascular:     Rate and Rhythm: Normal rate and regular rhythm.  Pulmonary:     Effort: Pulmonary effort is normal.     Breath sounds: Normal breath sounds. No wheezing.  Abdominal:     General: Abdomen is flat. Bowel sounds are normal.     Palpations: Abdomen is soft.     Tenderness: There is no abdominal tenderness.  Musculoskeletal:     Cervical back: Normal range of motion and neck supple. No tenderness.     Comments: Obvious deformity to the left ring finger  Skin:    General: Skin is warm and dry.     Capillary Refill: Capillary refill takes less than 2 seconds.  Neurological:     Mental Status: He is alert and oriented to person, place, and  time.     ED Results / Procedures / Treatments   Labs (all labs ordered are listed, but only abnormal results are displayed) Labs Reviewed - No data to display  EKG None  Radiology DG Finger Ring Left Result Date: 07/25/2023 CLINICAL DATA:  Reduction EXAM: LEFT RING FINGER 2+V COMPARISON:  Left finger x-ray earlier same day FINDINGS: Alignment of the fourth finger is now anatomic. No fracture visualized. Soft tissues are within normal limits. There is an overlying splint. IMPRESSION: Alignment of the fourth finger is now anatomic. No fracture visualized. Electronically Signed   By: Darliss Cheney M.D.   On: 07/25/2023 02:19   DG Finger Ring Left Result Date: 07/25/2023 CLINICAL DATA:  Left ring finger injury. EXAM: LEFT RING FINGER 2+V COMPARISON:  Left hand x-ray 05/10/2005 FINDINGS: There is 1 shaft  with posteromedial dislocation at the fourth proximal interphalangeal joint. No fracture identified. There is soft tissue swelling surrounding the dislocation. IMPRESSION: Posteromedial dislocation at the fourth proximal interphalangeal joint. Electronically Signed   By: Darliss Cheney M.D.   On: 07/25/2023 02:15    Procedures .Reduction of dislocation  Date/Time: 07/25/2023 2:32 AM  Performed by: Al Decant, PA-C Authorized by: Al Decant, PA-C  Consent: Verbal consent not obtained. Written consent not obtained. Consent given by: patient Imaging studies: imaging studies available Patient identity confirmed: verbally with patient and arm band Preparation: Patient was prepped and draped in the usual sterile fashion. Local anesthesia used: yes Anesthesia: local infiltration  Anesthesia: Local anesthesia used: yes Local Anesthetic: lidocaine 1% without epinephrine  Sedation: Patient sedated: no       Medications Ordered in ED Medications  oxyCODONE-acetaminophen (PERCOCET/ROXICET) 5-325 MG per tablet 1 tablet (1 tablet Oral Given 07/25/23 0142)  lidocaine (PF) (XYLOCAINE) 1 % injection 5 mL (5 mLs Other Given 07/25/23 0201)    ED Course/ Medical Decision Making/ A&P  Medical Decision Making Amount and/or Complexity of Data Reviewed Radiology: ordered.  Risk Prescription drug management.   32 year old male presents to ED for evaluation of injury of the left ring finger.  Please see HPI for further details.  On examination patient left ring finger is obviously dislocated.  X-ray imaging collected prior to reduction shows posteromedial dislocation of left fourth phalangeal joint.  This was reduced utilizing local anesthetic, ring block.  Patient was placed in static finger splint at this time.  Postreduction x-rays collected which shows normal anatomic alignment of left ring finger.  Patient will be advised to remain in static finger splint, referred over  to hand surgery, Dr. Janee Morn.  Advised to take ibuprofen or Tylenol in the interim.  Return precautions provided.  Patient discharged in stable condition.   Final Clinical Impression(s) / ED Diagnoses Final diagnoses:  Dislocation of finger, initial encounter    Rx / DC Orders ED Discharge Orders     None         Al Decant, PA-C 07/25/23 4132    Maia Plan, MD 07/25/23 [phone]

## 2023-08-28 DIAGNOSIS — M25642 Stiffness of left hand, not elsewhere classified: Secondary | ICD-10-CM | POA: Diagnosis not present

## 2023-08-30 ENCOUNTER — Ambulatory Visit: Payer: 59 | Attending: Orthopedic Surgery | Admitting: Occupational Therapy

## 2023-08-30 ENCOUNTER — Encounter: Payer: Self-pay | Admitting: Occupational Therapy

## 2023-08-30 DIAGNOSIS — M25642 Stiffness of left hand, not elsewhere classified: Secondary | ICD-10-CM | POA: Diagnosis not present

## 2023-08-30 DIAGNOSIS — R29898 Other symptoms and signs involving the musculoskeletal system: Secondary | ICD-10-CM | POA: Insufficient documentation

## 2023-08-30 DIAGNOSIS — R278 Other lack of coordination: Secondary | ICD-10-CM | POA: Diagnosis not present

## 2023-08-30 DIAGNOSIS — M6281 Muscle weakness (generalized): Secondary | ICD-10-CM | POA: Insufficient documentation

## 2023-08-30 DIAGNOSIS — R293 Abnormal posture: Secondary | ICD-10-CM | POA: Diagnosis not present
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
## 2023-08-30 NOTE — Patient Instructions (Addendum)

## 2023-09-03 ENCOUNTER — Ambulatory Visit: Payer: 59 | Admitting: Occupational Therapy

## 2023-09-03 DIAGNOSIS — R293 Abnormal posture: Secondary | ICD-10-CM

## 2023-09-03 DIAGNOSIS — R29898 Other symptoms and signs involving the musculoskeletal system: Secondary | ICD-10-CM

## 2023-09-03 DIAGNOSIS — R278 Other lack of coordination: Secondary | ICD-10-CM | POA: Diagnosis not present

## 2023-09-03 DIAGNOSIS — M6281 Muscle weakness (generalized): Secondary | ICD-10-CM

## 2023-09-03 DIAGNOSIS — M25642 Stiffness of left hand, not elsewhere classified: Secondary | ICD-10-CM | POA: Diagnosis not present
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
## 2023-09-05 ENCOUNTER — Ambulatory Visit: Payer: 59 | Admitting: Occupational Therapy

## 2023-09-05 DIAGNOSIS — R278 Other lack of coordination: Secondary | ICD-10-CM

## 2023-09-05 DIAGNOSIS — R293 Abnormal posture: Secondary | ICD-10-CM | POA: Diagnosis not present

## 2023-09-05 DIAGNOSIS — M25642 Stiffness of left hand, not elsewhere classified: Secondary | ICD-10-CM

## 2023-09-05 DIAGNOSIS — R29898 Other symptoms and signs involving the musculoskeletal system: Secondary | ICD-10-CM | POA: Diagnosis not present

## 2023-09-05 DIAGNOSIS — M6281 Muscle weakness (generalized): Secondary | ICD-10-CM | POA: Diagnosis not present
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
## 2023-09-05 NOTE — Patient Instructions (Addendum)
Heat Home Program Heat is used as part of your therapy for several reasons.  Heat increases blood flow, which promotes healing. Heat also relaxes muscles and joints which makes exercising easier.  Complete BEFORE exercise to help manage pain and increase stretch.  It can be applied for __10-15_ minutes,  _up to 3_ sessions per day  Use one of the following methods that is most convenient for you  Heating pad: Follow instructions given by manufacturer.  Use on low-medium setting.    Moist heated towel: Soak towel in hot water or place damp towel in microwave and heat for 30 sec. Check temperature to determine if further time needed.  Wring out towel and wrap around affected area, cover with a plastic bag.  Can also wrap a dry towel around the plastic to help retain the heat.    Microwave gel pack: Follow instructions given by manufacturer.  Check temperature before application to ensure not hot enough to cause a burn    Rice sock: This can be made using a sock with no synthetic material and 1.5 cups of rice.  Pour the rice into the sock, tie a knot at the top.  Place in microwave for 1 minute, remove to check temperature to determine if further heating time is required prior to application  Be sure to check the skin periodically to ensure no excessive redness or blisters.  Use with caution in areas with decreased sensation   Access Code: [email or web address] URL: [email or web address] Date: 09/05/2023 Prepared by: Amada Kingfisher  Exercises - Putty Squeezes  - 1-2 x daily - 10 reps - Finger Pinch and Pull with Putty  - 1-2 x daily - 10 reps - Tip PUSH with Putty  - 1-2 x daily - 10 reps - Finger Extension with Putty  - 1-2 x daily - 10 reps - Finger Adduction with Putty  - 1-2 x daily - 10 reps - Removing Marbles from Putty  - 1-2 x daily - 10 reps

## 2023-09-10 ENCOUNTER — Ambulatory Visit: Payer: 59 | Attending: Orthopedic Surgery | Admitting: Occupational Therapy

## 2023-09-10 DIAGNOSIS — R278 Other lack of coordination: Secondary | ICD-10-CM | POA: Diagnosis present

## 2023-09-10 DIAGNOSIS — R29898 Other symptoms and signs involving the musculoskeletal system: Secondary | ICD-10-CM

## 2023-09-10 DIAGNOSIS — M6281 Muscle weakness (generalized): Secondary | ICD-10-CM

## 2023-09-10 DIAGNOSIS — M25642 Stiffness of left hand, not elsewhere classified: Secondary | ICD-10-CM | POA: Diagnosis present
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
## 2023-09-12 ENCOUNTER — Ambulatory Visit: Payer: 59 | Admitting: Occupational Therapy

## 2023-09-12 DIAGNOSIS — R278 Other lack of coordination: Secondary | ICD-10-CM

## 2023-09-12 DIAGNOSIS — R29898 Other symptoms and signs involving the musculoskeletal system: Secondary | ICD-10-CM

## 2023-09-12 DIAGNOSIS — M6281 Muscle weakness (generalized): Secondary | ICD-10-CM

## 2023-09-12 DIAGNOSIS — M25642 Stiffness of left hand, not elsewhere classified: Secondary | ICD-10-CM | POA: Diagnosis not present
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
## 2023-09-17 ENCOUNTER — Ambulatory Visit: Payer: 59 | Admitting: Occupational Therapy

## 2023-09-19 ENCOUNTER — Ambulatory Visit: Payer: 59 | Admitting: Occupational Therapy

## 2023-09-24 ENCOUNTER — Encounter: Payer: Self-pay | Admitting: Occupational Therapy

## 2023-09-24 ENCOUNTER — Ambulatory Visit: Payer: 59 | Admitting: Occupational Therapy

## 2023-09-24 DIAGNOSIS — M25642 Stiffness of left hand, not elsewhere classified: Secondary | ICD-10-CM

## 2023-09-24 NOTE — Therapy (Signed)
 Pt is being discharged this date per our attendance policy as he has been a no show to 3 OT visits. This therapist contacted the patient at this time and LVM explaining d/c and need for new referral should additional therapy services be needed.

## 2023-09-26 ENCOUNTER — Encounter: Payer: 59 | Admitting: Occupational Therapy

## 2023-10-01 ENCOUNTER — Encounter: Payer: 59 | Admitting: Occupational Therapy

## 2023-10-03 ENCOUNTER — Encounter: Payer: 59 | Admitting: Occupational Therapy

## 2023-10-08 ENCOUNTER — Encounter: Payer: 59 | Admitting: Occupational Therapy

## 2023-10-10 ENCOUNTER — Encounter: Payer: 59 | Admitting: Occupational Therapy
